# Patient Record
Sex: Female | Born: 1980 | Race: Black or African American | Hispanic: No | Marital: Single | Smoking: Former smoker
Health system: Southern US, Community
[De-identification: ages and names within clinical notes are randomized; demographics above are authoritative.]

## PROBLEM LIST (undated history)

## (undated) ENCOUNTER — Inpatient Hospital Stay (HOSPITAL_COMMUNITY): Payer: Self-pay

## (undated) DIAGNOSIS — F32A Depression, unspecified: Secondary | ICD-10-CM

## (undated) DIAGNOSIS — R002 Palpitations: Secondary | ICD-10-CM

## (undated) DIAGNOSIS — N390 Urinary tract infection, site not specified: Secondary | ICD-10-CM

## (undated) DIAGNOSIS — R252 Cramp and spasm: Secondary | ICD-10-CM

## (undated) DIAGNOSIS — N159 Renal tubulo-interstitial disease, unspecified: Secondary | ICD-10-CM

## (undated) DIAGNOSIS — F329 Major depressive disorder, single episode, unspecified: Secondary | ICD-10-CM

## (undated) DIAGNOSIS — R748 Abnormal levels of other serum enzymes: Secondary | ICD-10-CM

## (undated) DIAGNOSIS — F419 Anxiety disorder, unspecified: Secondary | ICD-10-CM

---

## 2008-07-16 ENCOUNTER — Ambulatory Visit: Payer: Self-pay | Admitting: Family Medicine

## 2008-07-16 ENCOUNTER — Inpatient Hospital Stay (HOSPITAL_COMMUNITY): Admission: AD | Admit: 2008-07-16 | Discharge: 2008-07-31 | Payer: Self-pay | Admitting: Obstetrics & Gynecology

## 2008-07-16 ENCOUNTER — Encounter (INDEPENDENT_AMBULATORY_CARE_PROVIDER_SITE_OTHER): Payer: Self-pay | Admitting: Family Medicine

## 2008-07-17 ENCOUNTER — Encounter: Payer: Self-pay | Admitting: Family Medicine

## 2008-07-17 LAB — CONVERTED CEMR LAB: Trich, Wet Prep: NONE SEEN

## 2008-07-28 ENCOUNTER — Encounter: Payer: Self-pay | Admitting: Obstetrics & Gynecology

## 2010-08-09 ENCOUNTER — Emergency Department (HOSPITAL_COMMUNITY): Payer: Medicaid Other

## 2010-08-09 ENCOUNTER — Emergency Department (HOSPITAL_COMMUNITY)
Admission: EM | Admit: 2010-08-09 | Discharge: 2010-08-10 | Disposition: A | Discharge status: Home / Self Care | Payer: Medicaid Other | Attending: Emergency Medicine | Admitting: Emergency Medicine

## 2010-08-09 ENCOUNTER — Ambulatory Visit (INDEPENDENT_AMBULATORY_CARE_PROVIDER_SITE_OTHER): Payer: Medicaid Other

## 2010-08-09 ENCOUNTER — Inpatient Hospital Stay (INDEPENDENT_AMBULATORY_CARE_PROVIDER_SITE_OTHER)
Admission: RE | Admit: 2010-08-09 | Discharge: 2010-08-09 | Disposition: A | Discharge status: Home / Self Care | Payer: Medicaid Other | Source: Ambulatory Visit | Attending: Family Medicine | Admitting: Family Medicine

## 2010-08-09 DIAGNOSIS — R079 Chest pain, unspecified: Secondary | ICD-10-CM

## 2010-08-09 DIAGNOSIS — R110 Nausea: Secondary | ICD-10-CM | POA: Insufficient documentation

## 2010-08-09 DIAGNOSIS — R1011 Right upper quadrant pain: Secondary | ICD-10-CM | POA: Insufficient documentation

## 2010-08-09 DIAGNOSIS — R630 Anorexia: Secondary | ICD-10-CM | POA: Insufficient documentation

## 2010-08-09 DIAGNOSIS — R0609 Other forms of dyspnea: Secondary | ICD-10-CM | POA: Insufficient documentation

## 2010-08-09 DIAGNOSIS — R0989 Other specified symptoms and signs involving the circulatory and respiratory systems: Secondary | ICD-10-CM | POA: Insufficient documentation

## 2010-08-09 DIAGNOSIS — N12 Tubulo-interstitial nephritis, not specified as acute or chronic: Secondary | ICD-10-CM | POA: Insufficient documentation

## 2010-08-09 LAB — URINALYSIS, ROUTINE W REFLEX MICROSCOPIC
Glucose, UA: NEGATIVE mg/dL
Ketones, ur: >80 mg/dL — AB
Protein, ur: 30 mg/dL — AB

## 2010-08-09 LAB — COMPREHENSIVE METABOLIC PANEL
ALT: 9 U/L (ref 0–35)
AST: 12 U/L (ref 0–37)
CO2: 24 mEq/L (ref 19–32)
Chloride: 101 mEq/L (ref 96–112)
Creatinine, Ser: 0.79 mg/dL (ref 0.4–1.2)
GFR calc Af Amer: >60 mL/min (ref >60)
GFR calc non Af Amer: >60 mL/min (ref >60)
Sodium: 135 mEq/L (ref 135–145)
Total Bilirubin: 0.4 mg/dL (ref 0.3–1.2)

## 2010-08-09 LAB — CBC
HCT: 36.9 % (ref 36.0–46.0)
Hemoglobin: 12.6 g/dL (ref 12.0–15.0)
MCH: 29.9 pg (ref 26.0–34.0)
RBC: 4.22 MIL/uL (ref 3.87–5.11)

## 2010-08-09 LAB — DIFFERENTIAL
Basophils Relative: 0 % (ref 0–1)
Lymphocytes Relative: 28 % (ref 12–46)
Monocytes Relative: 8 % (ref 3–12)
Neutro Abs: 4.9 10*3/uL (ref 1.7–7.7)
Neutrophils Relative %: 64 % (ref 43–77)

## 2010-08-09 LAB — URINE MICROSCOPIC-ADD ON

## 2010-08-10 ENCOUNTER — Emergency Department (HOSPITAL_COMMUNITY): Payer: Medicaid Other

## 2010-08-10 ENCOUNTER — Encounter (HOSPITAL_COMMUNITY): Payer: Self-pay | Admitting: Radiology

## 2010-08-10 LAB — PREGNANCY, URINE: Preg Test, Ur: NEGATIVE

## 2010-08-10 MED ORDER — IOHEXOL 300 MG/ML  SOLN
100.0000 mL | Freq: Once | INTRAMUSCULAR | Status: AC | PRN
  Administered 2010-08-10: 100 mL via INTRAVENOUS

## 2010-09-02 LAB — CBC
MCHC: 33.8 g/dL (ref 30.0–36.0)
Platelets: 221 10*3/uL (ref 150–400)
RBC: 3.05 MIL/uL — ABNORMAL LOW (ref 3.87–5.11)
RBC: 3.43 MIL/uL — ABNORMAL LOW (ref 3.87–5.11)
RDW: 12.7 % (ref 11.5–15.5)
WBC: 14.7 10*3/uL — ABNORMAL HIGH (ref 4.0–10.5)

## 2010-09-02 LAB — DIFFERENTIAL
Basophils Relative: 0 % (ref 0–1)
Lymphs Abs: 2.3 10*3/uL (ref 0.7–4.0)
Monocytes Relative: 7 % (ref 3–12)
Neutro Abs: 11.3 10*3/uL — ABNORMAL HIGH (ref 1.7–7.7)
Neutrophils Relative %: 77 % (ref 43–77)

## 2010-09-02 LAB — TYPE AND SCREEN: Antibody Screen: POSITIVE

## 2010-09-07 LAB — POCT URINALYSIS DIP (DEVICE)
Bilirubin Urine: NEGATIVE
Glucose, UA: NEGATIVE mg/dL
Nitrite: NEGATIVE

## 2010-09-07 LAB — DIFFERENTIAL
Lymphocytes Relative: 14 % (ref 12–46)
Lymphs Abs: 1.3 10*3/uL (ref 0.7–4.0)
Monocytes Absolute: 0.4 10*3/uL (ref 0.1–1.0)
Monocytes Relative: 4 % (ref 3–12)
Neutro Abs: 7.9 10*3/uL — ABNORMAL HIGH (ref 1.7–7.7)

## 2010-09-07 LAB — CBC
Hemoglobin: 10.6 g/dL — ABNORMAL LOW (ref 12.0–15.0)
RBC: 3.33 MIL/uL — ABNORMAL LOW (ref 3.87–5.11)
WBC: 9.7 10*3/uL (ref 4.0–10.5)

## 2010-09-07 LAB — HEMOGLOBINOPATHY EVALUATION
Hemoglobin Other: 0 % (ref 0.0–0.0)
Hgb A2 Quant: 3 % (ref 2.2–3.2)
Hgb A2 Quant: 3 % (ref 2.2–3.2)
Hgb A: 97 % (ref 96.8–97.8)
Hgb F Quant: 0 % (ref 0.0–2.0)
Hgb F Quant: 0 % (ref 0.0–2.0)

## 2010-09-07 LAB — RUBELLA SCREEN: Rubella: 233.3 IU/mL — ABNORMAL HIGH

## 2010-09-07 LAB — HIV ANTIBODY (ROUTINE TESTING W REFLEX): HIV: NONREACTIVE

## 2010-10-05 NOTE — Op Note (Signed)
Tammy Ray, Tammy Ray             ACCOUNT NO.:  [id number]   MEDICAL RECORD NO.:  [id number]           PATIENT TYPE:   LOCATION:                                 FACILITY:   PHYSICIAN:  Lesly Dukes, M.D. DATE OF BIRTH:  August 03, 1980   DATE OF PROCEDURE:  07/28/2008  DATE OF DISCHARGE:                               OPERATIVE REPORT   INDICATIONS FOR SURGERY:  The patient is a 30 year old G5, para 2-0-2-2  with PPROM for weeks, currently at 32 weeks and 2 estimated gestational  age.  I was called to the room for a cord prolapse and the cord was  extruding from the vagina.  The patient was quickly consented for C-  section and rushed back for emergency C-section.  IV access was  obtained.  Midwife, Dr. Zerita Boers held the presenting part off the  cord and there was a pulse of approximately 100 until uterine incision.   PREOPERATIVE DIAGNOSIS:  A 30 year old G5 para 2-0-2-2 at 32 weeks and 2  days estimated gestational age with PPROM and cord prolapse.   POSTOPERATIVE DIAGNOSIS:  A 30 year old G5 para 2-0-2-2 at 32 weeks and  2 days estimated gestational age with PPROM and cord prolapse.   PROCEDURE:  Primary classical C-section.   SURGEON:  Lesly Dukes, MD   ASSISTANT:  Zerita Boers, certified nurse midwife.   ANESTHESIA:  General.   FINDINGS:  Viable infant, compound presentation with foot and hand out  of the incision, eventually rotated and delivered vertex.  Arterial cord  blood gas 7.25.  Gross normal uterus, ovaries, and fallopian tube.   ESTIMATED BLOOD LOSS:  800.   COMPLICATIONS:  None.  Placenta to Pathology.   PROCEDURE:  After informed consent was obtained, the patient was taken  to the operating room where general anesthesia was induced.  The patient  was already in dorsal supine position with a leftward tilt, prepared and  draped in normal sterile fashion with a Foley in the bladder.  Once  general intubation was initiated, the Pfannenstiel skin  incision was  made with scalpel and carried down through fascia.  The fascia was  incised in the midline and this was extended bilaterally bluntly.  The  superior and inferior aspects of the fascial incision were grasped with  Kocher clamps, tented up, and dissected off sharply from the underlying  rectus muscles.  The rectus muscles were separated in midline.  The  peritoneum was entered bluntly.  There was no developed lower uterine  segment nor evidence of a good presenting part.  The patient felt breech  on vaginal exam as well.  The classical incision was made in a vertical  fashion and extended with the bandage scissors.  The incision went all  the way to the fundus in order to get the baby out.  The cord was  clamped and cut and the baby was handed to the awaiting pediatrician.  Cord blood was sent for type and screen.  Arterial cord blood gas was  sent.  Placenta delivered manually and was sent to Pathology.  The  uterus was  cleared of all clots and debris and Pitocin was given to aid  in hemostasis.  The uterine incision was closed in two layers with 0  Monocryl in a running fashion.  The uterus was very friable, probably  evidence of infection, and the myometrium was ripping with the stitches,  so they were not locked in order to help keep the stitches from tearing  through.  The uterus was closed in two layers.  There was good  hemostasis at the end of the procedure.  The uterus was returned back to  the abdomen and noted again to be hemostatic off tension and inside the  abdomen.  The peritoneum was closed with 0 Vicryl.  The rectus muscle  was noted to be hemostatic.  This fascia was closed with 0 Vicryl in a  running fashion.  Subcutaneous tissue was irrigated and found to be  hemostatic and the skin was closed with staples.  Pressure dressing was  applied to the abdomen.  The patient tolerated the procedure well.  Sponge, lap, instrument, and needle count correct x2 and the  patient to  recovery room in stable condition.      Lesly Dukes, M.D.  Electronically Signed     KHL/MEDQ  D:  07/28/2008  T:  07/29/2008  Job:  [id number]

## 2010-10-05 NOTE — Discharge Summary (Signed)
Tammy Ray, Tammy Ray             ACCOUNT NO.:  [id number]   MEDICAL RECORD NO.:  [id number]          PATIENT TYPE:  INP   LOCATION:  9319                          FACILITY:  [facility]   PHYSICIAN:  Norton Blizzard, MD    DATE OF BIRTH:  1980-08-11   DATE OF ADMISSION:  07/16/2008  DATE OF DISCHARGE:  07/31/2008                               DISCHARGE SUMMARY   ADMISSION DIAGNOSES:  1. Intrauterine pregnancy at 30 and 3/7th weeks.  2. Preterm premature rupture of membranes.   DISCHARGE DIAGNOSIS:  Status post primary low-transverse cesarean  section for cord prolapse at 32 and 2/7th weeks' gestation.   PERTINENT STUDIES:  Preoperative hemoglobin of 11.1, postoperative  hemoglobin of 9.9.  Rubella immune.  B positive.   BRIEF HOSPITAL COURSE:  The patient is a 30 year old gravida 5, para 2-0-  2-2 who was initially admitted at 22 and 3/7th weeks gestation on  July 16, 2008, in the setting of preterm premature rupture of  membranes.  The patient was started on latency antibiotics, given  betamethasone and was maintained on bedrest and IV fluids.  The patient  was stable and did not show any signs or symptoms of chorioamnionitis;  however, on the night of July 28, 2008, the patient was noted to have a  prolapsed cord on examination, which prompted urgent primary cesarean  section.  She underwent a classical C-section because the patient did  not have a developed lower uterine segment, and the infant was noted to  be in breech presentation.  For further details of this operation,  please refer to separate dictated operative report.  The patient had no  complications during the procedure and had an uncomplicated postpartum  and postoperative course.  She was breast and bottle feeding.  She did  want a tubal ligation for contraception, but is currently considering  other methods.  By postoperative day #3, her pain was controlled on oral  pain medications.  She was ambulating and voiding  without difficulty and  passing flatus.  The patient was deemed stable for discharge to home.   DISCHARGE MEDICATIONS:  1. Percocet 5/325, 1-2 tablets p.o. q.6 h. p.r.n. pain.  2. Ibuprofen 600 mg p.o. q.6 h. p.r.n. pain.  3. Colace 100 mg p.o. b.i.d. p.r.n. constipation.  4. Prenatal vitamins 1 tablet p.o. daily.  5. Ferrous sulfate 325 mg p.o. b.i.d.   DISCHARGE INSTRUCTIONS:  The patient was given routine postpartum  instructions and told to call for any concerns or report to the MAU.  She will follow up in the health department in 6 weeks.  At the time of  discharge, the patient's infant is still in the newborn intensive care  unit.  If the patient still wants to proceed with the interval bilateral  tubal ligation, she will need to follow up in the GYN clinic in 2 weeks  to be scheduled for this procedure.      Norton Blizzard, MD  Electronically Signed     UAD/MEDQ  D:  07/31/2008  T:  07/31/2008  Job:  [id number] [id number]

## 2010-10-05 NOTE — Consult Note (Signed)
NAMEVERCIE, POKORNY             ACCOUNT NO.:  [id number]   MEDICAL RECORD NO.:  [id number]          PATIENT TYPE:  INP   LOCATION:  9319                          FACILITY:  [facility]   PHYSICIAN:  Antonietta Breach, M.D.  DATE OF BIRTH:  Dec 11, 1980   DATE OF CONSULTATION:  07/28/2008  DATE OF DISCHARGE:  07/31/2008                                 CONSULTATION   REASON FOR CONSULTATION:  Depression.   REQUESTING PHYSICIAN:  Scheryl Darter, MD   HISTORY OF PRESENT ILLNESS:  Ms. Filice is a 30 year old female admitted  to the [facility] with ruptured membranes at 32  weeks' gestation.   She has constructive future goals and interests; however, her mood has  been mildly depressed.  She has been displaying a mildly flat affect.  She denies any thoughts of harming herself or others.  She has no  hallucinations or delusions.  Her energy is mildly decreased.  She is  oriented to all spheres.  Her memory function is intact.  She is not  agitated or combative.   PAST PSYCHIATRIC HISTORY:  She does describe some multiple-day episodes  where she has experienced increased energy and decreased need for sleep.  She also has made a suicide attempt in the past with an overdose.   She has been treated with lithium as well as Seroquel in the past and  Paxil.  She stopped those medicines when she moved to [place] a  number of months ago and she did not have Medicaid.   In review of the past medical record, no additional history is gained on  her mental background.   FAMILY PSYCHIATRIC HISTORY:  None known.   SOCIAL HISTORY:  Ms. Augusta' boyfriend does want to have the baby.  This  was an unplanned pregnancy.  She denies any alcohol or illegal drugs.   PAST MEDICAL HISTORY:  Ruptured membranes at 32 weeks' gestation.  She  has not had any prenatal care.   She did have some difficulty with Ambien in the last evening where she  had 2 hallucination episodes.  She is currently on  Ambien 10 mg at  bedtime p.r.n.   She has no known drug allergies.   LABORATORY DATA:  WBC 14.7, hemoglobin 11.1, platelet count 249.  HIV  July 16, 2008, was negative.   REVIEW OF SYSTEMS:  Constitutional, head, eyes, ears, nose and throat,  mouth, neurologic, psychiatric, cardiovascular, respiratory,  gastrointestinal, genitourinary, skin, musculoskeletal, hematologic,  lymphatic, endocrine, metabolic all are unremarkable.   PHYSICAL EXAMINATION:  VITAL SIGNS:  Temperature 98.1, pulse 95,  respiratory rate 18, blood pressure 110/81.   GENERAL APPEARANCE:  Ms. Risinger is a young female appearing her  chronologic age, sitting up in her hospital chair with no abnormal  involuntary movements.   MENTAL STATUS:  Ms. Landrigan is alert.  Her eye contact is good.  Her  attention span is normal.  Her concentration is normal.  Her affect is  mildly flat at baseline, but as the conversation proceeds, she does have  a broad and appropriate smile regarding the content of conversation.  Her  mood is mildly depressed.  She is oriented to all spheres.  Her  memory is intact immediate, recent, and remote.   Her fund of knowledge and intelligence are within normal limits.  Her  speech involves normal rate and prosody without dysarthria.   Thought process is logical, coherent, goal-directed.  No looseness of  associations of thought content.  No thoughts of harming herself or  others.  No delusions or hallucinations.  Insight is intact.  Judgment  is intact.   ASSESSMENT:  AXIS I:  293.83 mood disorder, not otherwise specified  (history of idiopathic illness as well as current general medical  factor), depressed.  296.80 bipolar disorder, not otherwise specified.  AXIS II:  Deferred.  AXIS III:  See past medical history.  AXIS IV:  Primary support group, general medical.  AXIS V:  55.   Ms. Nutter is not at risk to harm herself or others.  She agrees to call  the Emergency Services  immediately if she develops thoughts of harming  herself or others, distress, racing thoughts, or elevated energy.   She has had some negative experiences with medications in the past, and  at this time, she declined psychotropic medications other than wanting  Benadryl for insomnia.   The indications, alternatives, and adverse effects of Benadryl were  explained to the patient.  She understands that might proceed with 45-50  mg p.o. at bedtime p.r.n. insomnia.   The undersigned discussed the indications, alternatives, and adverse  effects of Lamictal for bipolar disorder in preventing cyclic mood.  The  discussion included the risk of lethal rash.  Ms. Salas understands;  however, she declines any psychotropic medication at this time.  She  agrees to think about it.   She does not have any symptoms that would mandate treatment, in other  words she is not committable.   Also, she does agree to outpatient counseling to start immediately after  her discharge.   We would ask the social worker to arrange outpatient psychiatric  followup along with counseling during the first week of discharge.  In  addition to counseling for coping skills and stress management,  following up with a psychiatrist will allow her to maintain education  and the opportunity to restart preventive psychotropic medication.   We would continue to remind Ms. Saxe of the standard recommendation of  continuing at least a mood stabilizer such as Lamictal when having a  diagnosis of bipolar disorder.   The undersigned provided ego supportive psychotherapy in addition to  education.   When utilizing the Benadryl, would be cautious about oversedation or dry  mouth.      Antonietta Breach, M.D.  Electronically Signed     JW/MEDQ  D:  08/03/2008  T:  08/04/2008  Job:  [id number]

## 2011-07-24 ENCOUNTER — Encounter (HOSPITAL_COMMUNITY): Payer: Self-pay

## 2011-07-24 ENCOUNTER — Emergency Department (HOSPITAL_COMMUNITY): Payer: Medicaid Other

## 2011-07-24 ENCOUNTER — Emergency Department (HOSPITAL_COMMUNITY)
Admission: EM | Admit: 2011-07-24 | Discharge: 2011-07-25 | Disposition: A | Discharge status: Home Health | Payer: Medicaid Other | Attending: Emergency Medicine | Admitting: Emergency Medicine

## 2011-07-24 DIAGNOSIS — R4589 Other symptoms and signs involving emotional state: Secondary | ICD-10-CM | POA: Insufficient documentation

## 2011-07-24 DIAGNOSIS — Z331 Pregnant state, incidental: Secondary | ICD-10-CM | POA: Insufficient documentation

## 2011-07-24 DIAGNOSIS — O2390 Unspecified genitourinary tract infection in pregnancy, unspecified trimester: Secondary | ICD-10-CM | POA: Insufficient documentation

## 2011-07-24 DIAGNOSIS — N760 Acute vaginitis: Secondary | ICD-10-CM | POA: Insufficient documentation

## 2011-07-24 DIAGNOSIS — A499 Bacterial infection, unspecified: Secondary | ICD-10-CM | POA: Insufficient documentation

## 2011-07-24 DIAGNOSIS — N390 Urinary tract infection, site not specified: Secondary | ICD-10-CM

## 2011-07-24 DIAGNOSIS — B9689 Other specified bacterial agents as the cause of diseases classified elsewhere: Secondary | ICD-10-CM | POA: Insufficient documentation

## 2011-07-24 DIAGNOSIS — N949 Unspecified condition associated with female genital organs and menstrual cycle: Secondary | ICD-10-CM | POA: Insufficient documentation

## 2011-07-24 HISTORY — DX: Renal tubulo-interstitial disease, unspecified: N15.9

## 2011-07-24 HISTORY — DX: Urinary tract infection, site not specified: N39.0

## 2011-07-24 LAB — CBC
HCT: 36.8 % (ref 36.0–46.0)
MCH: 30 pg (ref 26.0–34.0)
MCHC: 34.2 g/dL (ref 30.0–36.0)
RDW: 12.8 % (ref 11.5–15.5)

## 2011-07-24 LAB — HCG, QUANTITATIVE, PREGNANCY: hCG, Beta Chain, Quant, S: 42270 m[IU]/mL — ABNORMAL HIGH (ref <5)

## 2011-07-24 LAB — DIFFERENTIAL
Basophils Absolute: 0 10*3/uL (ref 0.0–0.1)
Basophils Relative: 0 % (ref 0–1)
Eosinophils Absolute: 0 10*3/uL (ref 0.0–0.7)
Monocytes Absolute: 0.6 10*3/uL (ref 0.1–1.0)
Neutro Abs: 4.9 10*3/uL (ref 1.7–7.7)

## 2011-07-24 LAB — URINALYSIS, ROUTINE W REFLEX MICROSCOPIC
Glucose, UA: NEGATIVE mg/dL
Hgb urine dipstick: NEGATIVE
Specific Gravity, Urine: 1.034 — ABNORMAL HIGH (ref 1.005–1.030)
pH: 6.5 (ref 5.0–8.0)

## 2011-07-24 LAB — ABO/RH: ABO/RH(D): B POS

## 2011-07-24 LAB — URINE MICROSCOPIC-ADD ON

## 2011-07-24 LAB — COMPREHENSIVE METABOLIC PANEL
AST: 11 U/L (ref 0–37)
Albumin: 4.3 g/dL (ref 3.5–5.2)
BUN: 12 mg/dL (ref 6–23)
Calcium: 10.1 mg/dL (ref 8.4–10.5)
Chloride: 96 mEq/L (ref 96–112)
Creatinine, Ser: 0.74 mg/dL (ref 0.50–1.10)
Total Bilirubin: 0.3 mg/dL (ref 0.3–1.2)
Total Protein: 8.3 g/dL (ref 6.0–8.3)

## 2011-07-24 LAB — LIPASE, BLOOD: Lipase: 20 U/L (ref 11–59)

## 2011-07-24 NOTE — ED Notes (Signed)
Pt was hysterical when she was told about her pregnancy.  She does not want to carry the pregnancy due to her last pregnancy's complications (preterm infant).  Chaplain was called to talk with pt.  Pt requested that her significant other be called to come and sit with her.  Pt has currently calmed down and is talking with family.

## 2011-07-24 NOTE — ED Notes (Signed)
Pt c/o abdominal pain & vomiting that started yesterday.  Denies diarrhea.  Took a midol for pain.  LMP sometime in January.

## 2011-07-24 NOTE — ED Notes (Signed)
Denies diarrhea and urinary problems

## 2011-07-25 ENCOUNTER — Encounter (HOSPITAL_COMMUNITY): Payer: Self-pay | Admitting: Emergency Medicine

## 2011-07-25 LAB — RAPID URINE DRUG SCREEN, HOSP PERFORMED
Barbiturates: NOT DETECTED
Benzodiazepines: NOT DETECTED
Cocaine: NOT DETECTED
Opiates: NOT DETECTED

## 2011-07-25 LAB — WET PREP, GENITAL

## 2011-07-25 LAB — GC/CHLAMYDIA PROBE AMP, GENITAL: Chlamydia, DNA Probe: NEGATIVE

## 2011-07-25 MED ORDER — CEPHALEXIN 500 MG PO CAPS: 500.0000 mg | ORAL_CAPSULE | Freq: Four times a day (QID) | ORAL | Status: AC

## 2011-07-25 MED ORDER — ACETAMINOPHEN 325 MG PO TABS: 650.0000 mg | ORAL_TABLET | ORAL | Status: DC | PRN

## 2011-07-25 MED ORDER — CEPHALEXIN 500 MG PO CAPS
500.0000 mg | ORAL_CAPSULE | Freq: Four times a day (QID) | ORAL | Status: DC
  Administered 2011-07-25: 500 mg via ORAL
  Filled 2011-07-25: qty 1

## 2011-07-25 MED ORDER — METRONIDAZOLE 500 MG PO TABS
500.0000 mg | ORAL_TABLET | Freq: Two times a day (BID) | ORAL | Status: DC
  Administered 2011-07-25: 500 mg via ORAL
  Filled 2011-07-25: qty 1

## 2011-07-25 MED ORDER — ALUM & MAG HYDROXIDE-SIMETH 200-200-20 MG/5ML PO SUSP: 30.0000 mL | ORAL | Status: DC | PRN

## 2011-07-25 MED ORDER — ONDANSETRON HCL 4 MG PO TABS: 4.0000 mg | ORAL_TABLET | Freq: Three times a day (TID) | ORAL | Status: DC | PRN

## 2011-07-25 NOTE — Progress Notes (Signed)
Additional Note: The information contained in my previous note is of a confidential nature and should NOT be revealed to or discussed with pt's boyfriend without pt's private consent as revelation of some facts may result in harm to the pt.

## 2011-07-25 NOTE — ED Provider Notes (Signed)
  Physical Exam  BP 100/54  Pulse 79  Temp(Src) 97.7 F (36.5 C) (Oral)  Resp 20  SpO2 100%  LMP 07/10/2011  Physical Exam  ED Course  Procedures  MDM The patient exhibited a change of shift, results show that patient has urinary tract infection as well as bacterial vaginosis. Antibiotics ordered including Flagyl and Keflex. Vital signs remained stable without fever or tachycardia or hypotension. Patient has been evaluated by behavioral health and found to be suicidal. Will require further involuntary commitment and evaluation by behavioral health professionals.      Vida Roller, MD 07/25/11 [phone]

## 2011-07-25 NOTE — Discharge Instructions (Signed)
ABCs of Pregnancy A Antepartum care is very important. Be sure you see your doctor and get prenatal care as soon as you think you are pregnant. At this time, you will be tested for infection, genetic abnormalities and potential problems with you and the pregnancy. This is the time to discuss diet, exercise, work, medications, labor, pain medication during labor and the possibility of a cesarean delivery. Ask any questions that may concern you. It is important to see your doctor regularly throughout your pregnancy. Avoid exposure to toxic substances and chemicals - such as cleaning solvents, lead and mercury, some insecticides, and paint. Pregnant women should avoid exposure to paint fumes, and fumes that cause you to feel ill, dizzy or faint. When possible, it is a good idea to have a pre-pregnancy consultation with your caregiver to begin some important recommendations your caregiver suggests such as, taking folic acid, exercising, quitting smoking, avoiding alcoholic beverages, etc. B Breastfeeding is the healthiest choice for both you and your baby. It has many nutritional benefits for the baby and health benefits for the mother. It also creates a very tight and loving bond between the baby and mother. Talk to your doctor, your family and friends, and your employer about how you choose to feed your baby and how they can support you in your decision. Not all birth defects can be prevented, but a woman can take actions that may increase her chance of having a healthy baby. Many birth defects happen very early in pregnancy, sometimes before a woman even knows she is pregnant. Birth defects or abnormalities of any child in your or the father's family should be discussed with your caregiver. Get a good support bra as your breast size changes. Wear it especially when you exercise and when nursing.  C Celebrate the news of your pregnancy with the your spouse/father and family. Childbirth classes are helpful to  take for you and the spouse/father because it helps to understand what happens during the pregnancy, labor and delivery. Cesarean delivery should be discussed with your doctor so you are prepared for that possibility. The pros and cons of circumcision if it is a boy, should be discussed with your pediatrician. Cigarette smoking during pregnancy can result in low birth weight babies. It has been associated with infertility, miscarriages, tubal pregnancies, infant death (mortality) and poor health (morbidity) in childhood. Additionally, cigarette smoking may cause long-term learning disabilities. If you smoke, you should try to quit before getting pregnant and not smoke during the pregnancy. Secondary smoke may also harm a mother and her developing baby. It is a good idea to ask people to stop smoking around you during your pregnancy and after the baby is born. Extra calcium is necessary when you are pregnant and is found in your prenatal vitamin, in dairy products, green leafy vegetables and in calcium supplements. D A healthy diet according to your current weight and height, along with vitamins and mineral supplements should be discussed with your caregiver. Domestic abuse or violence should be made known to your doctor right away to get the situation corrected. Drink more water when you exercise to keep hydrated. Discomfort of your back and legs usually develops and progresses from the middle of the second trimester through to delivery of the baby. This is because of the enlarging baby and uterus, which may also affect your balance. Do not take illegal drugs. Illegal drugs can seriously harm the baby and you. Drink extra fluids (water is best) throughout pregnancy to help  your body keep up with the increases in your blood volume. Drink at least 6 to 8 glasses of water, fruit juice, or milk each day. A good way to know you are drinking enough fluid is when your urine looks almost like clear water or is very light  yellow.  E Eat healthy to get the nutrients you and your unborn baby need. Your meals should include the five basic food groups. Exercise (30 minutes of light to moderate exercise a day) is important and encouraged during pregnancy, if there are no medical problems or problems with the pregnancy. Exercise that causes discomfort or dizziness should be stopped and reported to your caregiver. Emotions during pregnancy can change from being ecstatic to depression and should be understood by you, your partner and your family. F Fetal screening with ultrasound, amniocentesis and monitoring during pregnancy and labor is common and sometimes necessary. Take 400 micrograms of folic acid daily both before, when possible, and during the first few months of pregnancy to reduce the risk of birth defects of the brain and spine. All women who could possibly become pregnant should take a vitamin with folic acid, every day. It is also important to eat a healthy diet with fortified foods (enriched grain products, including cereals, rice, breads, and pastas) and foods with natural sources of folate (orange juice, green leafy vegetables, beans, peanuts, broccoli, asparagus, peas, and lentils). The father should be involved with all aspects of the pregnancy including, the prenatal care, childbirth classes, labor, delivery, and postpartum time. Fathers may also have emotional concerns about being a father, financial needs, and raising a family. G Genetic testing should be done appropriately. It is important to know your family and the father's history. If there have been problems with pregnancies or birth defects in your family, report these to your doctor. Also, genetic counselors can talk with you about the information you might need in making decisions about having a family. You can call a major medical center in your area for help in finding a board-certified genetic counselor. Genetic testing and counseling should be done  before pregnancy when possible, especially if there is a history of problems in the mother's or father's family. Certain ethnic backgrounds are more at risk for genetic defects. H Get familiar with the hospital where you will be having your baby. Get to know how long it takes to get there, the labor and delivery area, and the hospital procedures. Be sure your medical insurance is accepted there. Get your home ready for the baby including, clothes, the baby's room (when possible), furniture and car seat. Hand washing is important throughout the day, especially after handling raw meat and poultry, changing the baby's diaper or using the bathroom. This can help prevent the spread of many bacteria and viruses that cause infection. Your hair may become dry and thinner, but will return to normal a few weeks after the baby is born. Heartburn is a common problem that can be treated by taking antacids recommended by your caregiver, eating smaller meals 5 or 6 times a day, not drinking liquids when eating, drinking between meals and raising the head of your bed 2 to 3 inches. I Insurance to cover you, the baby, doctor and hospital should be reviewed so that you will be prepared to pay any costs not covered by your insurance plan. If you do not have medical insurance, there are usually clinics and services available for you in your community. Take 30 milligrams of iron during  your pregnancy as prescribed by your doctor to reduce the risk of low red blood cells (anemia) later in pregnancy. All women of childbearing age should eat a diet rich in iron. J There should be a joint effort for the mother, father and any other children to adapt to the pregnancy financially, emotionally, and psychologically during the pregnancy. Join a support group for moms-to-be. Or, join a class on parenting or childbirth. Have the family participate when possible. K Know your limits. Let your caregiver know if you experience any of the  following:   Pain of any kind.   Strong cramps.   You develop a lot of weight in a short period of time (5 pounds in 3 to 5 days).   Vaginal bleeding, leaking of amniotic fluid.   Headache, vision problems.   Dizziness, fainting, shortness of breath.   Chest pain.   Fever of 102 F (38.9 C) or higher.   Gush of clear fluid from your vagina.   Painful urination.   Domestic violence.   Irregular heartbeat (palpitations).   Rapid beating of the heart (tachycardia).   Constant feeling sick to your stomach (nauseous) and vomiting.   Trouble walking, fluid retention (edema).   Muscle weakness.   If your baby has decreased activity.   Persistent diarrhea.   Abnormal vaginal discharge.   Uterine contractions at 20-minute intervals.   Back pain that travels down your leg.  L Learn and practice that what you eat and drink should be in moderation and healthy for you and your baby. Legal drugs such as alcohol and caffeine are important issues for pregnant women. There is no safe amount of alcohol a woman can drink while pregnant. Fetal alcohol syndrome, a disorder characterized by growth retardation, facial abnormalities, and central nervous system dysfunction, is caused by a woman's use of alcohol during pregnancy. Caffeine, found in tea, coffee, soft drinks and chocolate, should also be limited. Be sure to read labels when trying to cut down on caffeine during pregnancy. More than 200 foods, beverages, and over-the-counter medications contain caffeine and have a high salt content! There are coffees and teas that do not contain caffeine. M Medical conditions such as diabetes, epilepsy, and high blood pressure should be treated and kept under control before pregnancy when possible, but especially during pregnancy. Ask your caregiver about any medications that may need to be changed or adjusted during pregnancy. If you are currently taking any medications, ask your caregiver if it  is safe to take them while you are pregnant or before getting pregnant when possible. Also, be sure to discuss any herbs or vitamins you are taking. They are medicines, too! Discuss with your doctor all medications, prescribed and over-the-counter, that you are taking. During your prenatal visit, discuss the medications your doctor may give you during labor and delivery. N Never be afraid to ask your doctor or caregiver questions about your health, the progress of the pregnancy, family problems, stressful situations, and recommendation for a pediatrician, if you do not have one. It is better to take all precautions and discuss any questions or concerns you may have during your office visits. It is a good idea to write down your questions before you visit the doctor. O Over-the-counter cough and cold remedies may contain alcohol or other ingredients that should be avoided during pregnancy. Ask your caregiver about prescription, herbs or over-the-counter medications that you are taking or may consider taking while pregnant.  P Physical activity during pregnancy can  benefit both you and your baby by lessening discomfort and fatigue, providing a sense of well-being, and increasing the likelihood of early recovery after delivery. Light to moderate exercise during pregnancy strengthens the belly (abdominal) and back muscles. This helps improve posture. Practicing yoga, walking, swimming, and cycling on a stationary bicycle are usually safe exercises for pregnant women. Avoid scuba diving, exercise at high altitudes (over 3000 feet), skiing, horseback riding, contact sports, etc. Always check with your doctor before beginning any kind of exercise, especially during pregnancy and especially if you did not exercise before getting pregnant. Q Queasiness, stomach upset and morning sickness are common during pregnancy. Eating a couple of crackers or dry toast before getting out of bed. Foods that you normally love may  make you feel sick to your stomach. You may need to substitute other nutritious foods. Eating 5 or 6 small meals a day instead of 3 large ones may make you feel better. Do not drink with your meals, drink between meals. Questions that you have should be written down and asked during your prenatal visits. R Read about and make plans to baby-proof your home. There are important tips for making your home a safer environment for your baby. Review the tips and make your home safer for you and your baby. Read food labels regarding calories, salt and fat content in the food. S Saunas, hot tubs, and steam rooms should be avoided while you are pregnant. Excessive high heat may be harmful during your pregnancy. Your caregiver will screen and examine you for sexually transmitted diseases and genetic disorders during your prenatal visits. Learn the signs of labor. Sexual relations while pregnant is safe unless there is a medical or pregnancy problem and your caregiver advises against it. T Traveling long distances should be avoided especially in the third trimester of your pregnancy. If you do have to travel out of state, be sure to take a copy of your medical records and medical insurance plan with you. You should not travel long distances without seeing your doctor first. Most airlines will not allow you to travel after 36 weeks of pregnancy. Toxoplasmosis is an infection caused by a parasite that can seriously harm an unborn baby. Avoid eating undercooked meat and handling cat litter. Be sure to wear gloves when gardening. Tingling of the hands and fingers is not unusual and is due to fluid retention. This will go away after the baby is born. U Womb (uterus) size increases during the first trimester. Your kidneys will begin to function more efficiently. This may cause you to feel the need to urinate more often. You may also leak urine when sneezing, coughing or laughing. This is due to the growing uterus pressing  against your bladder, which lies directly in front of and slightly under the uterus during the first few months of pregnancy. If you experience burning along with frequency of urination or bloody urine, be sure to tell your doctor. The size of your uterus in the third trimester may cause a problem with your balance. It is advisable to maintain good posture and avoid wearing high heels during this time. An ultrasound of your baby may be necessary during your pregnancy and is safe for you and your baby. V Vaccinations are an important concern for pregnant women. Get needed vaccines before pregnancy. Center for Disease Control (http://www.wolf.info/) has clear guidelines for the use of vaccines during pregnancy. Review the list, be sure to discuss it with your doctor. Prenatal vitamins are helpful  and healthy for you and the baby. Do not take extra vitamins except what is recommended. Taking too much of certain vitamins can cause overdose problems. Continuous vomiting should be reported to your caregiver. Varicose veins may appear especially if there is a family history of varicose veins. They should subside after the delivery of the baby. Support hose helps if there is leg discomfort. W Being overweight or underweight during pregnancy may cause problems. Try to get within 15 pounds of your ideal weight before pregnancy. Remember, pregnancy is not a time to be dieting! Do not stop eating or start skipping meals as your weight increases. Both you and your baby need the calories and nutrition you receive from a healthy diet. Be sure to consult with your doctor about your diet. There is a formula and diet plan available depending on whether you are overweight or underweight. Your caregiver or nutritionist can help and advise you if necessary. X Avoid X-rays. If you must have dental work or diagnostic tests, tell your dentist or physician that you are pregnant so that extra care can be taken. X-rays should only be taken when  the risks of not taking them outweigh the risk of taking them. If needed, only the minimum amount of radiation should be used. When X-rays are necessary, protective lead shields should be used to cover areas of the body that are not being X-rayed. Y Your baby loves you. Breastfeeding your baby creates a loving and very close bond between the two of you. Give your baby a healthy environment to live in while you are pregnant. Infants and children require constant care and guidance. Their health and safety should be carefully watched at all times. After the baby is born, rest or take a nap when the baby is sleeping. Z Get your ZZZs. Be sure to get plenty of rest. Resting on your side as often as possible, especially on your left side is advised. It provides the best circulation to your baby and helps reduce swelling. Try taking a nap for 30 to 45 minutes in the afternoon when possible. After the baby is born rest or take a nap when the baby is sleeping. Try elevating your feet for that amount of time when possible. It helps the circulation in your legs and helps reduce swelling.  Most information courtesy of the CDC. Document Released: 05/09/2005 Document Revised: 04/28/2011 Document Reviewed: 01/21/2009 [facility] Patient Information 2012 [place], [place].Bacterial Vaginosis Bacterial vaginosis is an infection of the vagina. A healthy vagina has many kinds of good germs (bacteria). Sometimes the number of good germs can change. This allows bad germs to move in and cause an infection. You may be given medicine (antibiotics) to treat the infection. Or, you may not need treatment at all. HOME CARE  Take your medicine as told. Finish them even if you start to feel better.   Do not have sex until you finish your medicine.   Do not douche.   Practice safe sex.   Tell your sex partner that you have an infection. They should see their doctor for treatment if they have problems.  GET HELP RIGHT AWAY IF:  You  do not get better after 3 days of treatment.   You have grey fluid (discharge) coming from your vagina.   You have pain.   You have a temperature of 102 F (38.9 C) or higher.  MAKE SURE YOU:   Understand these instructions.   Will watch your condition.   Will get  help right away if you are not doing well or get worse.  Document Released: 02/16/2008 Document Revised: 04/28/2011 Document Reviewed: 02/16/2008 [facility] Patient Information 2012 [place], [place].   Asymptomatic Bacteriuria, Female Your urine study shows bacteria in your urine. You do not have the usual symptoms of burning or frequent urination. This is why it is called asymptomatic. You may need treatment with antibiotics. Treatment is especially important if you are pregnant. Sometimes this condition can progress to a more severe bladder or kidney infection. Symptoms include burning when urinating, back pain, fever, nausea, or vomiting. Take your antibiotics as directed. Finish them even if you start to feel better. Drink enough water and fluids to keep your urine clear or pale yellow. Go to the bathroom more frequently to keep your bladder empty. Keep the area around the vagina and rectum clean. Wipe yourself from front to back after urinating. Call your caregiver to arrange for follow-up care.  SEEK IMMEDIATE MEDICAL CARE IF:  You develop repeated vomiting.   You develop severe back or abdominal pain.   You have abnormal vaginal discharge or bleeding.   You have blood in the urine.   You develop cramping or abdominal pain.   You have a fever.  If you are pregnant and develop any of the above problems see your caregiver or seek care immediately. Document Released: 05/09/2005 Document Revised: 04/28/2011 Document Reviewed: 03/25/2009 [facility] Patient Information 2012 [place], [place].

## 2011-07-25 NOTE — Progress Notes (Signed)
Check to see if pt had a pcp She confirmed she did not by [facility]

## 2011-07-25 NOTE — ED Provider Notes (Signed)
History     CSN: [id number]  Arrival date & time 07/24/11  4098   First MD Initiated Contact with Patient 07/24/11 2044      Chief Complaint  Patient presents with  . Abdominal Pain  . Nausea  . Emesis    HPI Pt was seen at 2100.   Per pt, c/o gradual onset and persistence of multiple intermittent episodes of N/V and vague generalized abd "pain" since yesterday.  Pt endorses her LMP was approx 2 months ago, unsure if she is pregnant.  Pt has Hx G5P3.  Endorses today's symptoms are similar to when she was last pregnant.  Denies vaginal bleeding/discharge, no back/flank pain, no diarrhea, no fevers, no CP/SOB, no rash.     Past Medical History  Diagnosis Date  . UTI (lower urinary tract infection)   . Kidney infection     History reviewed. No pertinent past surgical history.   History  Substance Use Topics  . Smoking status: Current Everyday Smoker  . Smokeless tobacco: Not on file  . Alcohol Use: Yes    OB History    Grav Para Term Preterm Abortions TAB SAB Ect Mult Living   5         3      Review of Systems ROS: Statement: All systems negative except as marked or noted in the HPI; Constitutional: Negative for fever and chills. ; ; Eyes: Negative for eye pain, redness and discharge. ; ; ENMT: Negative for ear pain, hoarseness, nasal congestion, sinus pressure and sore throat. ; ; Cardiovascular: Negative for chest pain, palpitations, diaphoresis, dyspnea and peripheral edema. ; ; Respiratory: Negative for cough, wheezing and stridor. ; ; Gastrointestinal: +N/V, abd pain. Negative for diarrhea, blood in stool, hematemesis, jaundice and rectal bleeding. . ; ; Genitourinary: Negative for dysuria, flank pain and hematuria.;  GYN:  No vaginal bleeding, no vaginal discharge, no vulvar pain. ; Musculoskeletal: Negative for back pain and neck pain. Negative for swelling and trauma.; ; Skin: Negative for pruritus, rash, abrasions, blisters, bruising and skin lesion.; ; Neuro:  Negative for headache, lightheadedness and neck stiffness. Negative for weakness, altered level of consciousness , altered mental status, extremity weakness, paresthesias, involuntary movement, seizure and syncope.       Allergies  Review of patient's allergies indicates no known allergies.  Home Medications  No current outpatient prescriptions on file.  BP 123/88  Pulse 80  Temp(Src) 99.1 F (37.3 C) (Oral)  Resp 16  SpO2 99%  LMP 07/10/2011  Physical Exam 2115: Physical examination:  Nursing notes reviewed; Vital signs and O2 SAT reviewed;  Constitutional: Well developed, Well nourished, Well hydrated, In no acute distress; Head:  Normocephalic, atraumatic; Eyes: EOMI, PERRL, No scleral icterus; ENMT: Mouth and pharynx normal, Mucous membranes moist; Neck: Supple, Full range of motion, No lymphadenopathy; Cardiovascular: Regular rate and rhythm, No murmur or gallop; Respiratory: Breath sounds clear & equal bilaterally, No rales, rhonchi, wheezes, Normal respiratory effort/excursion; Chest: Nontender, Movement normal; Abdomen: Soft, Nontender, Nondistended, Normal bowel sounds; Genitourinary: No CVA tenderness; Pelvic exam performed with permission of pt and female ED tech assist during exam.  External genitalia w/o lesions. Vaginal vault with thick white discharge.  Cervix closed and w/o bleeding, no lesions, not friable, GC/chlam and wet prep obtained and sent to lab.  Bimanual exam w/o CMT, uterine or adnexal tenderness.;  Extremities: Pulses normal, No tenderness, No edema, No calf edema or asymmetry.; Neuro: AA&Ox3, Major CN grossly intact.  No gross focal motor or  sensory deficits in extremities.; Skin: Color normal, Warm, Dry, no rash.; Psych:  Tearful.   ED Course  Procedures   2200:  Pt informed of pregnancy.  Immediately tearful, asks "how did this happen?" and "why am I being punished like this?"  Pt endorses she does not use birth control, as her significant other "doesn't  like to wear condoms."  Chaplain was in to see pt.  Pt endorses to myself, ED RN, and now the Chaplain that she "just wants to kill herself" knowing that she is pregnant.  States that she "can't afford" another child.  Will have ACT/Social Worker eval pt after workup completed.  12:24 AM:  +UTI, UC pending.  Pt would not allow sonographer to perform transvaginal US, but likely gestational sac seen in endometrial cavity, no ectopic.  UDS and wet prep results pending.  ACT team called and informed of above, will come to eval pt.    1:10 AM:  Sign out to Dr. Hyacinth Meeker.  Needs UDS recollected, ACT team to eval.  If discharged, need tx for UTI and BV, f/u OB/GYN.    MDM  MDM Reviewed: nursing note and vitals Interpretation: labs and ultrasound     Results for orders placed during the hospital encounter of 07/24/11  CBC      Component Value Range   WBC 8.3  4.0 - 10.5 (K/uL)   RBC 4.20  3.87 - 5.11 (MIL/uL)   Hemoglobin 12.6  12.0 - 15.0 (g/dL)   HCT 16.1  09.6 - 04.5 (%)   MCV 87.6  78.0 - 100.0 (fL)   MCH 30.0  26.0 - 34.0 (pg)   MCHC 34.2  30.0 - 36.0 (g/dL)   RDW 40.9  81.1 - 91.4 (%)   Platelets 299  150 - 400 (K/uL)  DIFFERENTIAL      Component Value Range   Neutrophils Relative 59  43 - 77 (%)   Neutro Abs 4.9  1.7 - 7.7 (K/uL)   Lymphocytes Relative 33  12 - 46 (%)   Lymphs Abs 2.8  0.7 - 4.0 (K/uL)   Monocytes Relative 7  3 - 12 (%)   Monocytes Absolute 0.6  0.1 - 1.0 (K/uL)   Eosinophils Relative 0  0 - 5 (%)   Eosinophils Absolute 0.0  0.0 - 0.7 (K/uL)   Basophils Relative 0  0 - 1 (%)   Basophils Absolute 0.0  0.0 - 0.1 (K/uL)  COMPREHENSIVE METABOLIC PANEL      Component Value Range   Sodium 132 (*) 135 - 145 (mEq/L)   Potassium 3.2 (*) 3.5 - 5.1 (mEq/L)   Chloride 96  96 - 112 (mEq/L)   CO2 24  19 - 32 (mEq/L)   Glucose, Bld 90  70 - 99 (mg/dL)   BUN 12  6 - 23 (mg/dL)   Creatinine, Ser 7.82  0.50 - 1.10 (mg/dL)   Calcium 95.6  8.4 - 10.5 (mg/dL)   Total Protein  8.3  6.0 - 8.3 (g/dL)   Albumin 4.3  3.5 - 5.2 (g/dL)   AST 11  0 - 37 (U/L)   ALT 6  0 - 35 (U/L)   Alkaline Phosphatase 75  39 - 117 (U/L)   Total Bilirubin 0.3  0.3 - 1.2 (mg/dL)   GFR calc non Af Amer >90  >90 (mL/min)   GFR calc Af Amer >90  >90 (mL/min)  URINALYSIS, ROUTINE W REFLEX MICROSCOPIC      Component Value Range   Color,  Urine YELLOW  YELLOW    APPearance CLOUDY (*) CLEAR    Specific Gravity, Urine 1.034 (*) 1.005 - 1.030    pH 6.5  5.0 - 8.0    Glucose, UA NEGATIVE  NEGATIVE (mg/dL)   Hgb urine dipstick NEGATIVE  NEGATIVE    Bilirubin Urine NEGATIVE  NEGATIVE    Ketones, ur >80 (*) NEGATIVE (mg/dL)   Protein, ur NEGATIVE  NEGATIVE (mg/dL)   Urobilinogen, UA 1.0  0.0 - 1.0 (mg/dL)   Nitrite POSITIVE (*) NEGATIVE    Leukocytes, UA MODERATE (*) NEGATIVE   LIPASE, BLOOD      Component Value Range   Lipase 20  11 - 59 (U/L)  POCT PREGNANCY, URINE      Component Value Range   Preg Test, Ur POSITIVE (*) NEGATIVE   URINE MICROSCOPIC-ADD ON      Component Value Range   Squamous Epithelial / LPF MANY (*) RARE    WBC, UA TOO NUMEROUS TO COUNT  <3 (WBC/hpf)   RBC / HPF 3-6  <3 (RBC/hpf)   Bacteria, UA MANY (*) RARE    Urine-Other MUCOUS PRESENT    HCG, QUANTITATIVE, PREGNANCY      Component Value Range   hCG, Beta Chain, Quant, S 42270 (*) <5 (mIU/mL)  ABO/RH      Component Value Range   ABO/RH(D) B POS    ETHANOL      Component Value Range   Alcohol, Ethyl (B) <11  0 - 11 (mg/dL)   US Ob Comp Less 14 Wks 07/24/2011  *RADIOLOGY REPORT*  Clinical Data: Pelvic pain.  OBSTETRIC <14 WK ULTRASOUND  Technique:  Transabdominal ultrasound was performed for evaluation of the gestation as well as the maternal uterus and adnexal regions.  Comparison:  None.  Intrauterine gestational sac: Slightly irregular gestational sac. Yolk sac: None Embryo: None Cardiac Activity: None  MSD: 22 mm  7w  1d [place] EDC: 03/10/2012  Maternal uterus/Adnexae: No subchorionic hemorrhage.  The ovaries  were not visualized. Uterus is retroflexed.  The study is limited because the patient refused transvaginal exam and the bladder is empty and the uterus is retroflexed.  IMPRESSION: Suboptimally visualized irregular appearing area of lucency in the endometrial cavity probably representing a gestational sac.  No visible fetal pole.  Original Report Authenticated By: Gwynn Burly, M.D.       Laray Anger, DO 07/26/11 2339
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
## 2011-07-25 NOTE — ED Provider Notes (Signed)
12:47 PM  Discussed with Child psychotherapist, states patient is essentially clear from a psychiatric standpoint. She denies any thoughts to harm herself or any other people. She apparently does states she was upset when she found out. She was pregnant. Patient was reassessed in the room, and appears quite well. From an emotional and psychosocial standpoint. She was stable for outpatient followup.   However, her ultrasound was discussed with her. There was no yolk sac, no embryo, no cardiac activity.  This in the setting of a quantitative beta hCG of 42,000. Likely consistent with an abnormal or nonviable pregnancy. Will discuss with GYN and obtain further outpatient recommendations.    Discussed with Dr Estanislado Pandy.     Recommend f/u HCG on Thursday or Friday.  Recommended outpatient followup for repeat hCG testing and repeat ultrasound testing is needed.   All relevant information was given to the patient. Patient feels comfortable going home. She, understands she'll need to followup at [facility] in a few days. She also knows to return to the ED or call 911 if she should have any concerning symptoms such as increasing abdominal pain, vaginal bleeding, dizziness, or syncope.  She was told that this current pregnancy is likely nonviable.   No evidence for ectopic pregnancy at this time.  Patient was also cautioned to return to the ED for any depression or suicidal thoughts. At this time. She is smiling and jovial and appears responsible.  Patient is stable for discharge at this time. Instructions given. Patient voices understanding.           Taliya Mcclard A. Patrica Duel, MD 07/25/11 9147

## 2011-07-25 NOTE — ED Notes (Signed)
Patient resting quietly with eyes closed and deep/even respirations.  Offers no c/o at this time.
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
## 2011-07-25 NOTE — Progress Notes (Signed)
CSW provided patient with referral sheet for local LME and therapists in the area.  Patient appreciative of information. Expressed no additional needs at this time.  Ileene Hutchinson , MSW, LCSWA 07/25/2011 1:22 PM [phone]

## 2011-07-26 LAB — URINE CULTURE
Colony Count: >=100000
Culture  Setup Time: 201303040431

## 2011-07-27 NOTE — ED Notes (Signed)
+   Urine Patient treated with Keflex-sensitive to same-chart appended per protocol MD. 

## 2014-01-20 ENCOUNTER — Encounter (HOSPITAL_COMMUNITY): Payer: Self-pay | Admitting: Emergency Medicine

## 2014-01-20 ENCOUNTER — Emergency Department (HOSPITAL_COMMUNITY): Payer: Medicaid Other

## 2014-01-20 ENCOUNTER — Emergency Department (HOSPITAL_COMMUNITY)
Admission: EM | Admit: 2014-01-20 | Discharge: 2014-01-20 | Disposition: A | Discharge status: Home / Self Care | Payer: Medicaid Other | Attending: Emergency Medicine | Admitting: Emergency Medicine

## 2014-01-20 DIAGNOSIS — Z3202 Encounter for pregnancy test, result negative: Secondary | ICD-10-CM | POA: Diagnosis not present

## 2014-01-20 DIAGNOSIS — R110 Nausea: Secondary | ICD-10-CM | POA: Insufficient documentation

## 2014-01-20 DIAGNOSIS — F17200 Nicotine dependence, unspecified, uncomplicated: Secondary | ICD-10-CM | POA: Diagnosis not present

## 2014-01-20 DIAGNOSIS — Z87448 Personal history of other diseases of urinary system: Secondary | ICD-10-CM | POA: Diagnosis not present

## 2014-01-20 DIAGNOSIS — Z792 Long term (current) use of antibiotics: Secondary | ICD-10-CM | POA: Diagnosis not present

## 2014-01-20 DIAGNOSIS — M5489 Other dorsalgia: Secondary | ICD-10-CM

## 2014-01-20 DIAGNOSIS — M549 Dorsalgia, unspecified: Secondary | ICD-10-CM | POA: Diagnosis present

## 2014-01-20 DIAGNOSIS — Z87440 Personal history of urinary (tract) infections: Secondary | ICD-10-CM | POA: Diagnosis not present

## 2014-01-20 LAB — BASIC METABOLIC PANEL
Anion gap: 13 (ref 5–15)
BUN: 18 mg/dL (ref 6–23)
CO2: 23 mEq/L (ref 19–32)
Calcium: 9.7 mg/dL (ref 8.4–10.5)
Chloride: 101 mEq/L (ref 96–112)
Creatinine, Ser: 0.82 mg/dL (ref 0.50–1.10)
GFR calc Af Amer: >90 mL/min (ref >90)
GLUCOSE: 88 mg/dL (ref 70–99)
POTASSIUM: 4.2 meq/L (ref 3.7–5.3)
Sodium: 137 mEq/L (ref 137–147)

## 2014-01-20 LAB — CBC WITH DIFFERENTIAL/PLATELET
Basophils Absolute: 0 10*3/uL (ref 0.0–0.1)
Basophils Relative: 0 % (ref 0–1)
EOS ABS: 0 10*3/uL (ref 0.0–0.7)
EOS PCT: 0 % (ref 0–5)
HEMATOCRIT: 36.5 % (ref 36.0–46.0)
HEMOGLOBIN: 12.4 g/dL (ref 12.0–15.0)
LYMPHS ABS: 2.6 10*3/uL (ref 0.7–4.0)
Lymphocytes Relative: 34 % (ref 12–46)
MCH: 31.1 pg (ref 26.0–34.0)
MCHC: 34 g/dL (ref 30.0–36.0)
MCV: 91.5 fL (ref 78.0–100.0)
MONOS PCT: 7 % (ref 3–12)
Monocytes Absolute: 0.6 10*3/uL (ref 0.1–1.0)
Neutro Abs: 4.5 10*3/uL (ref 1.7–7.7)
Neutrophils Relative %: 59 % (ref 43–77)
Platelets: 265 10*3/uL (ref 150–400)
RBC: 3.99 MIL/uL (ref 3.87–5.11)
RDW: 12 % (ref 11.5–15.5)
WBC: 7.6 10*3/uL (ref 4.0–10.5)

## 2014-01-20 LAB — URINALYSIS, ROUTINE W REFLEX MICROSCOPIC
Bilirubin Urine: NEGATIVE
Glucose, UA: NEGATIVE mg/dL
Hgb urine dipstick: NEGATIVE
Ketones, ur: NEGATIVE mg/dL
LEUKOCYTES UA: NEGATIVE
Nitrite: NEGATIVE
PROTEIN: NEGATIVE mg/dL
Specific Gravity, Urine: 1.027 (ref 1.005–1.030)
UROBILINOGEN UA: 1 mg/dL (ref 0.0–1.0)
pH: 7 (ref 5.0–8.0)

## 2014-01-20 LAB — POC URINE PREG, ED: Preg Test, Ur: NEGATIVE

## 2014-01-20 MED ORDER — SODIUM CHLORIDE 0.9 % IV BOLUS (SEPSIS)
1000.0000 mL | Freq: Once | INTRAVENOUS | Status: AC
  Administered 2014-01-20: 1000 mL via INTRAVENOUS

## 2014-01-20 MED ORDER — IOHEXOL 350 MG/ML SOLN
80.0000 mL | Freq: Once | INTRAVENOUS | Status: AC | PRN
  Administered 2014-01-20: 50 mL via INTRAVENOUS

## 2014-01-20 MED ORDER — ONDANSETRON HCL 4 MG/2ML IJ SOLN
4.0000 mg | Freq: Once | INTRAMUSCULAR | Status: AC
  Administered 2014-01-20: 4 mg via INTRAVENOUS
  Filled 2014-01-20: qty 2

## 2014-01-20 MED ORDER — MORPHINE SULFATE 4 MG/ML IJ SOLN
4.0000 mg | Freq: Once | INTRAMUSCULAR | Status: AC
  Administered 2014-01-20: 4 mg via INTRAVENOUS
  Filled 2014-01-20: qty 1

## 2014-01-20 NOTE — ED Provider Notes (Signed)
CSN: [id number]     Arrival date & time 01/20/14  1335 History   First MD Initiated Contact with Patient 01/20/14 1542     Chief Complaint  Patient presents with  . Back Pain  . Abdominal Pain  . Leg Pain     (Consider location/radiation/quality/duration/timing/severity/associated sxs/prior Treatment) HPI Tammy Ray 33 y.o. with a pmh of UTI presents with right flank pain and LLE cramping. Right flank pain is sharp, non radiating, no known relieving factors. Similar to previous "kidney infections" in the past. LLE cramping has been ongoing in the past 2-3 days. No estrogen use. No long car/plane rides. No Family or personal history of VTE. No V/D, but some slight nausea with the pain. No vaginal dc or vaginal bleeding.   Past Medical History  Diagnosis Date  . UTI (lower urinary tract infection)   . Kidney infection    History reviewed. No pertinent past surgical history. No family history on file. History  Substance Use Topics  . Smoking status: Current Every Day Smoker  . Smokeless tobacco: Not on file  . Alcohol Use: Yes   OB History   Grav Para Term Preterm Abortions TAB SAB Ect Mult Living   5         3     Review of Systems  Gastrointestinal: Positive for abdominal pain.  Musculoskeletal: Positive for back pain.       Leg pain  All other systems reviewed and are negative.     Allergies  Review of patient's allergies indicates no known allergies.  Home Medications   Prior to Admission medications   Medication Sig Start Date End Date Taking? Authorizing Provider  HYDROcodone-acetaminophen (NORCO) 7.5-325 MG per tablet Take 1 tablet by mouth every 6 (six) hours as needed for moderate pain.   Yes Historical Provider, MD  penicillin v potassium (VEETID) 250 MG tablet Take 250 mg by mouth 4 (four) times daily.    Historical Provider, MD   BP 113/69  Pulse 73  Temp(Src) 98.2 F (36.8 C) (Oral)  Resp 20  Ht  (1.702 m)  Wt 140 lb (63.504 kg)  BMI 21.92  kg/m2  SpO2 100%  LMP 12/28/2013 Physical Exam  Constitutional: She is oriented to person, place, and time. She appears well-developed and well-nourished. No distress.  HENT:  Head: Normocephalic and atraumatic.  Right Ear: External ear normal.  Left Ear: External ear normal.  Eyes: Conjunctivae and EOM are normal. Right eye exhibits no discharge. Left eye exhibits no discharge.  Neck: Normal range of motion. Neck supple. No JVD present.  Cardiovascular: Normal rate, regular rhythm and normal heart sounds.  Exam reveals no gallop and no friction rub.   No murmur heard. Pulmonary/Chest: Effort normal and breath sounds normal. No stridor. No respiratory distress. She has no wheezes. She has no rales. She exhibits no tenderness.  Abdominal: Soft. Bowel sounds are normal. She exhibits no distension. There is no tenderness. There is no rebound and no guarding.  Musculoskeletal: Normal range of motion. She exhibits no edema.  Neurological: She is alert and oriented to person, place, and time.  Skin: Skin is warm. No rash noted. She is not diaphoretic.  Psychiatric: She has a normal mood and affect. Her behavior is normal.    ED Course  Procedures (including critical care time) Labs Review Labs Reviewed  URINE CULTURE  URINALYSIS, ROUTINE W REFLEX MICROSCOPIC  CBC WITH DIFFERENTIAL  BASIC METABOLIC PANEL  POC URINE PREG, ED  Imaging Review Ct Angio Chest Pe W/cm &/or Wo Cm  01/20/2014   CLINICAL DATA:  Pleuritic back pain, evaluate for PE  EXAM: CT ANGIOGRAPHY CHEST WITH CONTRAST  TECHNIQUE: Multidetector CT imaging of the chest was performed using the standard protocol during bolus administration of intravenous contrast. Multiplanar CT image reconstructions and MIPs were obtained to evaluate the vascular anatomy.  CONTRAST:  50mL OMNIPAQUE IOHEXOL 350 MG/ML SOLN  COMPARISON:  CTA chest dated 08/10/2010  FINDINGS: No evidence of pulmonary embolism.  Lungs are clear. No suspicious  pulmonary nodules. No pleural effusion or pneumothorax.  Visualized thyroid is unremarkable.  The heart is normal in size.  No pericardial effusion.  No suspicious mediastinal, hilar, or axillary lymphadenopathy.  Visualized upper abdomen is unremarkable.  Visualized osseous structures are within normal limits.  Review of the MIP images confirms the above findings.  IMPRESSION: No evidence of pulmonary embolism.  No evidence of acute cardiopulmonary disease.   Electronically Signed   By: Charline Bills M.D.   On: 01/20/2014 19:03     EKG Interpretation None      MDM   Final diagnoses:  Right-sided back pain, unspecified location    Pt presents with right flank pain that is pleuritic. No fever. UA neg for UTI. No LE edema or swelling or sig ttp. Given her pleuritic symptoms CTA PE performed and negative for PE. Likely musculoskeletal pain. Urine preg neg. No leukocytosis. No sig electrolyte abn. NSAIDS for pain. Strong return precautions given for worsening symptoms or any other alarming or concerning symptoms or issues. The patient was in agreement with the treatment plan and I answered all of their questions. The patient was stable for dc. At dc, the patient ambulated without difficulty, was moving all four extremities, symptoms improved, NAD. and AOx4 Care discussed with my attending, Dr. Jodi Mourning. If performed and available, imaging studies and labs reviewed.      Sena Hitch, MD 01/20/14 2351

## 2014-01-20 NOTE — Discharge Instructions (Signed)
Back Pain, Adult Low back pain is very common. About 1 in 5 people have back pain.The cause of low back pain is rarely dangerous. The pain often gets better over time.About half of people with a sudden onset of back pain feel better in just 2 weeks. About 8 in 10 people feel better by 6 weeks.  CAUSES Some common causes of back pain include:  Strain of the muscles or ligaments supporting the spine.  Wear and tear (degeneration) of the spinal discs.  Arthritis.  Direct injury to the back. DIAGNOSIS Most of the time, the direct cause of low back pain is not known.However, back pain can be treated effectively even when the exact cause of the pain is unknown.Answering your caregiver's questions about your overall health and symptoms is one of the most accurate ways to make sure the cause of your pain is not dangerous. If your caregiver needs more information, he or she may order lab work or imaging tests (X-rays or MRIs).However, even if imaging tests show changes in your back, this usually does not require surgery. HOME CARE INSTRUCTIONS For many people, back pain returns.Since low back pain is rarely dangerous, it is often a condition that people can learn to manageon their own.   Remain active. It is stressful on the back to sit or stand in one place. Do not sit, drive, or stand in one place for more than 30 minutes at a time. Take short walks on level surfaces as soon as pain allows.Try to increase the length of time you walk each day.  Do not stay in bed.Resting more than 1 or 2 days can delay your recovery.  Do not avoid exercise or work.Your body is made to move.It is not dangerous to be active, even though your back may hurt.Your back will likely heal faster if you return to being active before your pain is gone.  Pay attention to your body when you bend and lift. Many people have less discomfortwhen lifting if they bend their knees, keep the load close to their bodies,and  avoid twisting. Often, the most comfortable positions are those that put less stress on your recovering back.  Find a comfortable position to sleep. Use a firm mattress and lie on your side with your knees slightly bent. If you lie on your back, put a pillow under your knees.  Only take over-the-counter or prescription medicines as directed by your caregiver. Over-the-counter medicines to reduce pain and inflammation are often the most helpful.Your caregiver may prescribe muscle relaxant drugs.These medicines help dull your pain so you can more quickly return to your normal activities and healthy exercise.  Put ice on the injured area.  Put ice in a plastic bag.  Place a towel between your skin and the bag.  Leave the ice on for 15-20 minutes, 03-04 times a day for the first 2 to 3 days. After that, ice and heat may be alternated to reduce pain and spasms.  Ask your caregiver about trying back exercises and gentle massage. This may be of some benefit.  Avoid feeling anxious or stressed.Stress increases muscle tension and can worsen back pain.It is important to recognize when you are anxious or stressed and learn ways to manage it.Exercise is a great option. SEEK MEDICAL CARE IF:  You have pain that is not relieved with rest or medicine.  You have pain that does not improve in 1 week.  You have new symptoms.  You are generally not feeling well. SEEK   IMMEDIATE MEDICAL CARE IF:   You have pain that radiates from your back into your legs.  You develop new bowel or bladder control problems.  You have unusual weakness or numbness in your arms or legs.  You develop nausea or vomiting.  You develop abdominal pain.  You feel faint. Document Released: 05/09/2005 Document Revised: 11/08/2011 Document Reviewed: 09/10/2013 [employer] Patient Information 2015 [employer]. This information is not intended to replace advice given to you by your health care provider. Make sure you  discuss any questions you have with your health care provider.  

## 2014-01-20 NOTE — ED Provider Notes (Signed)
Medical screening examination/treatment/procedure(s) were conducted as a shared visit with non-physician practitioner(s) or resident  and myself.  I personally evaluated the patient during the encounter and agree with the findings.   I have personally reviewed any xrays and/ or EKG's with the provider and I agree with interpretation.     Enid Skeens, MD 01/20/14 [phone]

## 2014-01-20 NOTE — ED Notes (Signed)
Patient states has had R back pain, R abdominal pain and L leg pain x 4 days.   Patient states has been nauseated, but denies vomiting or diarrhea.   Patient states "it's hard to pee".

## 2014-01-21 LAB — URINE CULTURE
Colony Count: NO GROWTH
Culture: NO GROWTH

## 2014-01-31 ENCOUNTER — Ambulatory Visit: Payer: Medicaid Other | Attending: Internal Medicine | Admitting: Internal Medicine

## 2014-01-31 ENCOUNTER — Ambulatory Visit (HOSPITAL_COMMUNITY)
Admission: RE | Admit: 2014-01-31 | Discharge: 2014-01-31 | Disposition: A | Discharge status: Home / Self Care | Payer: Medicaid Other | Source: Ambulatory Visit | Attending: Internal Medicine | Admitting: Internal Medicine

## 2014-01-31 ENCOUNTER — Encounter: Payer: Self-pay | Admitting: Internal Medicine

## 2014-01-31 VITALS — BP 129/85 | HR 86 | Temp 98.6°F | Resp 16 | Ht 66.5 in | Wt 136.0 lb

## 2014-01-31 DIAGNOSIS — Z3049 Encounter for surveillance of other contraceptives: Secondary | ICD-10-CM | POA: Diagnosis not present

## 2014-01-31 DIAGNOSIS — M545 Low back pain, unspecified: Secondary | ICD-10-CM | POA: Diagnosis not present

## 2014-01-31 DIAGNOSIS — Z23 Encounter for immunization: Secondary | ICD-10-CM | POA: Insufficient documentation

## 2014-01-31 DIAGNOSIS — F17200 Nicotine dependence, unspecified, uncomplicated: Secondary | ICD-10-CM | POA: Insufficient documentation

## 2014-01-31 DIAGNOSIS — Z3009 Encounter for other general counseling and advice on contraception: Secondary | ICD-10-CM | POA: Diagnosis not present

## 2014-01-31 LAB — POCT URINE PREGNANCY: PREG TEST UR: NEGATIVE

## 2014-01-31 MED ORDER — IBUPROFEN 600 MG PO TABS: 600.0000 mg | ORAL_TABLET | Freq: Three times a day (TID) | ORAL | Status: DC | PRN

## 2014-01-31 MED ORDER — MEDROXYPROGESTERONE ACETATE 150 MG/ML IM SUSP
150.0000 mg | Freq: Once | INTRAMUSCULAR | Status: AC
  Administered 2014-01-31: 150 mg via INTRAMUSCULAR

## 2014-01-31 NOTE — Patient Instructions (Signed)
It is very important that you stop smoking to decrease your chances of blood clot formation while on this medication, as discussed during the visit  Medroxyprogesterone injection [Contraceptive] What is this medicine? MEDROXYPROGESTERONE (me DROX ee proe JES te rone) contraceptive injections prevent pregnancy. They provide effective birth control for 3 months. Depo-subQ Provera 104 is also used for treating pain related to endometriosis. This medicine may be used for other purposes; ask your health care provider or pharmacist if you have questions. COMMON BRAND NAME(S): Depo-Provera, Depo-subQ Provera 104 What should I tell my health care provider before I take this medicine? They need to know if you have any of these conditions: -frequently drink alcohol -asthma -blood vessel disease or a history of a blood clot in the lungs or legs -bone disease such as osteoporosis -breast cancer -diabetes -eating disorder (anorexia nervosa or bulimia) -high blood pressure -HIV infection or AIDS -kidney disease -liver disease -mental depression -migraine -seizures (convulsions) -stroke -tobacco smoker -vaginal bleeding -an unusual or allergic reaction to medroxyprogesterone, other hormones, medicines, foods, dyes, or preservatives -pregnant or trying to get pregnant -breast-feeding How should I use this medicine? Depo-Provera Contraceptive injection is given into a muscle. Depo-subQ Provera 104 injection is given under the skin. These injections are given by a health care professional. You must not be pregnant before getting an injection. The injection is usually given during the first 5 days after the start of a menstrual period or 6 weeks after delivery of a baby. Talk to your pediatrician regarding the use of this medicine in children. Special care may be needed. These injections have been used in female children who have started having menstrual periods. Overdosage: If you think you have taken  too much of this medicine contact a poison control center or emergency room at once. NOTE: This medicine is only for you. Do not share this medicine with others. What if I miss a dose? Try not to miss a dose. You must get an injection once every 3 months to maintain birth control. If you cannot keep an appointment, call and reschedule it. If you wait longer than 13 weeks between Depo-Provera contraceptive injections or longer than 14 weeks between Depo-subQ Provera 104 injections, you could get pregnant. Use another method for birth control if you miss your appointment. You may also need a pregnancy test before receiving another injection. What may interact with this medicine? Do not take this medicine with any of the following medications: -bosentan This medicine may also interact with the following medications: -aminoglutethimide -antibiotics or medicines for infections, especially rifampin, rifabutin, rifapentine, and griseofulvin -aprepitant -barbiturate medicines such as phenobarbital or primidone -bexarotene -carbamazepine -medicines for seizures like ethotoin, felbamate, oxcarbazepine, phenytoin, topiramate -modafinil -St. John's wort This list may not describe all possible interactions. Give your health care provider a list of all the medicines, herbs, non-prescription drugs, or dietary supplements you use. Also tell them if you smoke, drink alcohol, or use illegal drugs. Some items may interact with your medicine. What should I watch for while using this medicine? This drug does not protect you against HIV infection (AIDS) or other sexually transmitted diseases. Use of this product may cause you to lose calcium from your bones. Loss of calcium may cause weak bones (osteoporosis). Only use this product for more than 2 years if other forms of birth control are not right for you. The longer you use this product for birth control the more likely you will be at risk for weak bones. Ask  your  health care professional how you can keep strong bones. You may have a change in bleeding pattern or irregular periods. Many females stop having periods while taking this drug. If you have received your injections on time, your chance of being pregnant is very low. If you think you may be pregnant, see your health care professional as soon as possible. Tell your health care professional if you want to get pregnant within the next year. The effect of this medicine may last a long time after you get your last injection. What side effects may I notice from receiving this medicine? Side effects that you should report to your doctor or health care professional as soon as possible: -allergic reactions like skin rash, itching or hives, swelling of the face, lips, or tongue -breast tenderness or discharge -breathing problems -changes in vision -depression -feeling faint or lightheaded, falls -fever -pain in the abdomen, chest, groin, or leg -problems with balance, talking, walking -unusually weak or tired -yellowing of the eyes or skin Side effects that usually do not require medical attention (report to your doctor or health care professional if they continue or are bothersome): -acne -fluid retention and swelling -headache -irregular periods, spotting, or absent periods -temporary pain, itching, or skin reaction at site where injected -weight gain This list may not describe all possible side effects. Call your doctor for medical advice about side effects. You may report side effects to FDA at 1-800-FDA-1088. Where should I keep my medicine? This does not apply. The injection will be given to you by a health care professional. NOTE: This sheet is a summary. It may not cover all possible information. If you have questions about this medicine, talk to your doctor, pharmacist, or health care provider.  2015, [employer] Standard. (2008-05-30 18:37:56)  Smoking Cessation Quitting smoking is  important to your health and has many advantages. However, it is not always easy to quit since nicotine is a very addictive drug. Oftentimes, people try 3 times or more before being able to quit. This document explains the best ways for you to prepare to quit smoking. Quitting takes hard work and a lot of effort, but you can do it. ADVANTAGES OF QUITTING SMOKING  You will live longer, feel better, and live better.  Your body will feel the impact of quitting smoking almost immediately.  Within 20 minutes, blood pressure decreases. Your pulse returns to its normal level.  After 8 hours, carbon monoxide levels in the blood return to normal. Your oxygen level increases.  After 24 hours, the chance of having a heart attack starts to decrease. Your breath, hair, and body stop smelling like smoke.  After 48 hours, damaged nerve endings begin to recover. Your sense of taste and smell improve.  After 72 hours, the body is virtually free of nicotine. Your bronchial tubes relax and breathing becomes easier.  After 2 to 12 weeks, lungs can hold more air. Exercise becomes easier and circulation improves.  The risk of having a heart attack, stroke, cancer, or lung disease is greatly reduced.  After 1 year, the risk of coronary heart disease is cut in half.  After 5 years, the risk of stroke falls to the same as a nonsmoker.  After 10 years, the risk of lung cancer is cut in half and the risk of other cancers decreases significantly.  After 15 years, the risk of coronary heart disease drops, usually to the level of a nonsmoker.  If you are pregnant, quitting smoking will  improve your chances of having a healthy baby.  The people you live with, especially any children, will be healthier.  You will have extra money to spend on things other than cigarettes. QUESTIONS TO THINK ABOUT BEFORE ATTEMPTING TO QUIT You may want to talk about your answers with your health care provider.  Why do you want to  quit?  If you tried to quit in the past, what helped and what did not?  What will be the most difficult situations for you after you quit? How will you plan to handle them?  Who can help you through the tough times? Your family? Friends? A health care provider?  What pleasures do you get from smoking? What ways can you still get pleasure if you quit? Here are some questions to ask your health care provider:  How can you help me to be successful at quitting?  What medicine do you think would be best for me and how should I take it?  What should I do if I need more help?  What is smoking withdrawal like? How can I get information on withdrawal? GET READY  Set a quit date.  Change your environment by getting rid of all cigarettes, ashtrays, matches, and lighters in your home, car, or work. Do not let people smoke in your home.  Review your past attempts to quit. Think about what worked and what did not. GET SUPPORT AND ENCOURAGEMENT You have a better chance of being successful if you have help. You can get support in many ways.  Tell your family, friends, and coworkers that you are going to quit and need their support. Ask them not to smoke around you.  Get individual, group, or telephone counseling and support. Programs are available at [employer] and health centers. Call your local health department for information about programs in your area.  Spiritual beliefs and practices may help some smokers quit.  Download a "quit meter" on your computer to keep track of quit statistics, such as how long you have gone without smoking, cigarettes not smoked, and money saved.  Get a self-help book about quitting smoking and staying off tobacco. LEARN NEW SKILLS AND BEHAVIORS  Distract yourself from urges to smoke. Talk to someone, go for a walk, or occupy your time with a task.  Change your normal routine. Take a different route to work. Drink tea instead of coffee. Eat breakfast in  a different place.  Reduce your stress. Take a hot bath, exercise, or read a book.  Plan something enjoyable to do every day. Reward yourself for not smoking.  Explore interactive web-based programs that specialize in helping you quit. GET MEDICINE AND USE IT CORRECTLY Medicines can help you stop smoking and decrease the urge to smoke. Combining medicine with the above behavioral methods and support can greatly increase your chances of successfully quitting smoking.  Nicotine replacement therapy helps deliver nicotine to your body without the negative effects and risks of smoking. Nicotine replacement therapy includes nicotine gum, lozenges, inhalers, nasal sprays, and skin patches. Some may be available over-the-counter and others require a prescription.  Antidepressant medicine helps people abstain from smoking, but how this works is unknown. This medicine is available by prescription.  Nicotinic receptor partial agonist medicine simulates the effect of nicotine in your brain. This medicine is available by prescription. Ask your health care provider for advice about which medicines to use and how to use them based on your health history. Your health care provider will  tell you what side effects to look out for if you choose to be on a medicine or therapy. Carefully read the information on the package. Do not use any other product containing nicotine while using a nicotine replacement product.  RELAPSE OR DIFFICULT SITUATIONS Most relapses occur within the first 3 months after quitting. Do not be discouraged if you start smoking again. Remember, most people try several times before finally quitting. You may have symptoms of withdrawal because your body is used to nicotine. You may crave cigarettes, be irritable, feel very hungry, cough often, get headaches, or have difficulty concentrating. The withdrawal symptoms are only temporary. They are strongest when you first quit, but they will go away  within 10-14 days. To reduce the chances of relapse, try to:  Avoid drinking alcohol. Drinking lowers your chances of successfully quitting.  Reduce the amount of caffeine you consume. Once you quit smoking, the amount of caffeine in your body increases and can give you symptoms, such as a rapid heartbeat, sweating, and anxiety.  Avoid smokers because they can make you want to smoke.  Do not let weight gain distract you. Many smokers will gain weight when they quit, usually less than 10 pounds. Eat a healthy diet and stay active. You can always lose the weight gained after you quit.  Find ways to improve your mood other than smoking. FOR MORE INFORMATION  www.smokefree.gov  Document Released: 05/03/2001 Document Revised: 09/23/2013 Document Reviewed: 08/18/2011 [facility] Patient Information 2015 [place], [place]. This information is not intended to replace advice given to you by your health care provider. Make sure you discuss any questions you have with your health care provider.

## 2014-01-31 NOTE — Progress Notes (Signed)
Patient ID: Tammy Ray, female   DOB: 1981-02-10, 33 y.o.   MRN: [id number]  [id number]  DOB - [birth year]  CC:  Chief Complaint  Patient presents with  . Establish Care       HPI: Tammy Ray is a 33 y.o. female here today to establish medical care.  Patient presents to clinic today as a ED follow up for right flank pain.  She states that everything was negative in the ED and she was sent home with pain medication.  She reports the pain has improved since then.  Last pap smear was last year and it was normal.  She reports that she has had thoracic spine pain since she was a child.  The pain is described as achy in nature. She has tried tylenol in the past for the pain with some relief.  She denies any bowel or bladder dysfunction. She reports pain on her right middle finger close to the nail.  She denies redness or warmth of the finger.     No Known Allergies Past Medical History  Diagnosis Date  . UTI (lower urinary tract infection)   . Kidney infection    Current Outpatient Prescriptions on File Prior to Visit  Medication Sig Dispense Refill  . HYDROcodone-acetaminophen (NORCO) 7.5-325 MG per tablet Take 1 tablet by mouth every 6 (six) hours as needed for moderate pain.      Marland Kitchen penicillin v potassium (VEETID) 250 MG tablet Take 250 mg by mouth 4 (four) times daily.       No current facility-administered medications on file prior to visit.   Family History  Problem Relation Age of Onset  . Cancer Maternal Aunt   . Cancer Maternal Grandmother    History   Social History  . Marital Status: Single    Spouse Name: N/A    Number of Children: N/A  . Years of Education: N/A   Occupational History  . Not on file.   Social History Main Topics  . Smoking status: Current Every Day Smoker  . Smokeless tobacco: Not on file  . Alcohol Use: Yes  . Drug Use: Not on file  . Sexual Activity: Not on file   Other Topics Concern  . Not on file   Social  History Narrative  . No narrative on file    Review of Systems  Constitutional: Positive for malaise/fatigue. Negative for fever, chills and weight loss.  Eyes: Negative.   Respiratory: Negative.   Cardiovascular: Negative for palpitations, claudication and leg swelling.  Gastrointestinal: Negative.   Genitourinary: Negative.  Negative for flank pain.  Musculoskeletal: Positive for back pain. Negative for falls, joint pain and neck pain.  Neurological: Positive for dizziness and weakness. Negative for tingling, tremors and headaches.  Endo/Heme/Allergies: Negative.       Objective:   Filed Vitals:   01/31/14 1508  BP: 129/85  Pulse: 86  Temp: 98.6 F (37 C)  Resp: 16    Physical Exam  Vitals reviewed. Constitutional: She is oriented to person, place, and time. No distress.  HENT:  Right Ear: External ear normal.  Left Ear: External ear normal.  Mouth/Throat: Oropharynx is clear and moist.  Eyes: Conjunctivae and EOM are normal. Pupils are equal, round, and reactive to light.  Neck: Normal range of motion. Neck supple.  Cardiovascular: Normal rate, regular rhythm and normal heart sounds.   Pulmonary/Chest: Effort normal and breath sounds normal.  Abdominal: Soft. Bowel sounds are normal.  Musculoskeletal: Normal range of motion.  Neurological: She is alert and oriented to person, place, and time. She has normal reflexes.  Skin: Skin is warm and dry.  Psychiatric: She has a normal mood and affect. Thought content normal.     Lab Results  Component Value Date   WBC 7.6 01/20/2014   HGB 12.4 01/20/2014   HCT 36.5 01/20/2014   MCV 91.5 01/20/2014   PLT 265 01/20/2014   Lab Results  Component Value Date   CREATININE 0.82 01/20/2014   BUN 18 01/20/2014   NA 137 01/20/2014   K 4.2 01/20/2014   CL 101 01/20/2014   CO2 23 01/20/2014    No results found for this basename: HGBA1C   Lipid Panel  No results found for this basename: chol, trig, hdl, cholhdl, vldl, ldlcalc         Assessment and plan:   Tammy Ray was seen today for establish care.  Diagnoses and associated orders for this visit:  Midline low back pain without sciatica - DG Lumbar Spine Complete; Future - ibuprofen (ADVIL,MOTRIN) 600 MG tablet; Take 1 tablet (600 mg total) by mouth every 8 (eight) hours as needed.  Counseling for initiation of birth control method - POCT urine pregnancy-negative  - medroxyPROGESTERone (DEPO-PROVERA) injection 150 mg; Inject 1 mL (150 mg total) into the muscle once.  Explained all risk associated with medication. Strongly advised patient to quit smoking especially for the risk of blood clots.    Need for prophylactic vaccination and inoculation against influenza received   Return in about 3 months (around 05/02/2014) for DEPO injection .  The patient was given clear instructions to go to ER or return to medical center if symptoms don't improve, worsen or new problems develop. The patient verbalized understanding.   Holland Commons, NP-C [facility] [phone] 02/17/2014, 3:15 PM

## 2014-01-31 NOTE — Progress Notes (Signed)
Pt is here to establish care. ED following up after kidney pain on her right side. Pt's right hand middle finger is very tender at the end of her nail. Pt suffers from chronic back pain.

## 2014-02-03 ENCOUNTER — Telehealth: Payer: Self-pay | Admitting: Internal Medicine

## 2014-02-03 NOTE — Telephone Encounter (Signed)
Pt called stating that she received the  medroxyPROGESTERone (DEPO-PROVERA) injection 150 mg on 01/31/2014 and was told that if she started having different symptoms to let the Dr. Ashley Mariner. Pt states that her breast are tender and is having discharge. Please f/u with pt.

## 2014-02-04 ENCOUNTER — Encounter: Payer: Self-pay | Admitting: Internal Medicine

## 2014-02-04 ENCOUNTER — Other Ambulatory Visit (HOSPITAL_COMMUNITY)
Admission: RE | Admit: 2014-02-04 | Discharge: 2014-02-04 | Disposition: A | Discharge status: Home / Self Care | Payer: Medicaid Other | Source: Ambulatory Visit | Attending: Internal Medicine | Admitting: Internal Medicine

## 2014-02-04 ENCOUNTER — Ambulatory Visit: Payer: Medicaid Other | Attending: Internal Medicine | Admitting: Internal Medicine

## 2014-02-04 VITALS — BP 122/85 | HR 79 | Temp 99.4°F | Resp 16

## 2014-02-04 DIAGNOSIS — F17200 Nicotine dependence, unspecified, uncomplicated: Secondary | ICD-10-CM | POA: Diagnosis not present

## 2014-02-04 DIAGNOSIS — N644 Mastodynia: Secondary | ICD-10-CM | POA: Insufficient documentation

## 2014-02-04 DIAGNOSIS — N898 Other specified noninflammatory disorders of vagina: Secondary | ICD-10-CM | POA: Diagnosis not present

## 2014-02-04 DIAGNOSIS — G43909 Migraine, unspecified, not intractable, without status migrainosus: Secondary | ICD-10-CM | POA: Diagnosis not present

## 2014-02-04 DIAGNOSIS — N760 Acute vaginitis: Secondary | ICD-10-CM | POA: Diagnosis present

## 2014-02-04 DIAGNOSIS — K5900 Constipation, unspecified: Secondary | ICD-10-CM | POA: Diagnosis not present

## 2014-02-04 DIAGNOSIS — Z113 Encounter for screening for infections with a predominantly sexual mode of transmission: Secondary | ICD-10-CM | POA: Insufficient documentation

## 2014-02-04 DIAGNOSIS — M79609 Pain in unspecified limb: Secondary | ICD-10-CM | POA: Insufficient documentation

## 2014-02-04 NOTE — Telephone Encounter (Signed)
Pt notified breast tenderness is side effect of Depo inj,  Pt will make apt to see PCP for discharge

## 2014-02-04 NOTE — Progress Notes (Signed)
Patient ID: Tammy Ray, female   DOB: 1981/02/02, 33 y.o.   MRN: [id number]  CC: follow up  HPI:  Patient presents to clinic today with c/o pain at depo injection site.  She recently received a depo-provera injection and the influenza.  She states that she is now having headaches and breast tenderness.  She also c/o of vaginal discharge for two days that is white and thick with no associated odor. She denies any recent exposures to STD's, lesions, or itch.   No Known Allergies Past Medical History  Diagnosis Date  . UTI (lower urinary tract infection)   . Kidney infection    Current Outpatient Prescriptions on File Prior to Visit  Medication Sig Dispense Refill  . HYDROcodone-acetaminophen (NORCO) 7.5-325 MG per tablet Take 1 tablet by mouth every 6 (six) hours as needed for moderate pain.      Marland Kitchen ibuprofen (ADVIL,MOTRIN) 600 MG tablet Take 1 tablet (600 mg total) by mouth every 8 (eight) hours as needed.  30 tablet  1  . penicillin v potassium (VEETID) 250 MG tablet Take 250 mg by mouth 4 (four) times daily.       No current facility-administered medications on file prior to visit.   Family History  Problem Relation Age of Onset  . Cancer Maternal Aunt   . Cancer Maternal Grandmother    History   Social History  . Marital Status: Single    Spouse Name: N/A    Number of Children: N/A  . Years of Education: N/A   Occupational History  . Not on file.   Social History Main Topics  . Smoking status: Current Every Day Smoker  . Smokeless tobacco: Not on file  . Alcohol Use: Yes  . Drug Use: Not on file  . Sexual Activity: Not on file   Other Topics Concern  . Not on file   Social History Narrative  . No narrative on file    Review of Systems: See HPI    Objective:   Filed Vitals:   02/04/14 1732  BP: 122/85  Pulse: 79  Temp: 99.4 F (37.4 C)  Resp: 16    Physical Exam  Cardiovascular: Normal rate, regular rhythm and normal heart sounds.    Pulmonary/Chest: Effort normal and breath sounds normal. Right breast exhibits tenderness. Right breast exhibits no mass. Left breast exhibits tenderness. Left breast exhibits no mass.  Genitourinary: Right adnexum displays no tenderness. Left adnexum displays no tenderness. No vaginal discharge found.  Lymphadenopathy:       Right: No inguinal adenopathy present.       Left: No inguinal adenopathy present.  Skin:        Lab Results  Component Value Date   WBC 7.6 01/20/2014   HGB 12.4 01/20/2014   HCT 36.5 01/20/2014   MCV 91.5 01/20/2014   PLT 265 01/20/2014   Lab Results  Component Value Date   CREATININE 0.82 01/20/2014   BUN 18 01/20/2014   NA 137 01/20/2014   K 4.2 01/20/2014   CL 101 01/20/2014   CO2 23 01/20/2014    No results found for this basename: HGBA1C   Lipid Panel  No results found for this basename: chol, trig, hdl, cholhdl, vldl, ldlcalc       Assessment and plan:   Tammy Ray was seen today for follow-up.  Diagnoses and associated orders for this visit:  Vaginal discharge - Cervicovaginal ancillary only  Breast tenderness Likely from hormones of Depo injection  Holland Commons, NP-C [facility] [phone] 02/04/2014, 5:45 PM

## 2014-02-04 NOTE — Progress Notes (Signed)
Pt is here today because she feels like she may be having side effects from her DEPO injection. Pt c/o a soar tender feeling under her right arm pit. Pt is also having migraines with constipation.

## 2014-02-13 ENCOUNTER — Telehealth: Payer: Self-pay | Admitting: Internal Medicine

## 2014-02-13 NOTE — Telephone Encounter (Signed)
Pt called back because she received a phone call from the nurse. Please call pt back with results.

## 2014-02-14 ENCOUNTER — Telehealth: Payer: Self-pay | Admitting: Emergency Medicine

## 2014-02-14 MED ORDER — METRONIDAZOLE 500 MG PO TABS: 500.0000 mg | ORAL_TABLET | Freq: Two times a day (BID) | ORAL | Status: DC

## 2014-02-14 NOTE — Telephone Encounter (Signed)
Left message for pt to call for lab results Medication e-scribed to [facility] pharmacy 

## 2014-02-17 ENCOUNTER — Telehealth: Payer: Self-pay | Admitting: Emergency Medicine

## 2014-02-17 NOTE — Telephone Encounter (Signed)
Pt called in for lab results. Pt given positive BV results with instructions to start taking prescribed Flagyl 500 mg tab BID x 7 days Pt also informed to refrain from alcohol while taking medication. Medication e-scribed to [facility] pharmacy

## 2014-03-24 ENCOUNTER — Encounter: Payer: Self-pay | Admitting: Internal Medicine

## 2014-04-28 ENCOUNTER — Ambulatory Visit: Payer: Medicaid Other | Attending: Internal Medicine | Admitting: Internal Medicine

## 2014-04-28 ENCOUNTER — Encounter: Payer: Self-pay | Admitting: Internal Medicine

## 2014-04-28 VITALS — BP 117/83 | HR 73 | Temp 98.5°F | Resp 16 | Ht 67.0 in | Wt 154.0 lb

## 2014-04-28 DIAGNOSIS — F17200 Nicotine dependence, unspecified, uncomplicated: Secondary | ICD-10-CM | POA: Diagnosis not present

## 2014-04-28 DIAGNOSIS — Z0000 Encounter for general adult medical examination without abnormal findings: Secondary | ICD-10-CM | POA: Diagnosis not present

## 2014-04-28 DIAGNOSIS — Z3042 Encounter for surveillance of injectable contraceptive: Secondary | ICD-10-CM | POA: Diagnosis not present

## 2014-04-28 DIAGNOSIS — Z309 Encounter for contraceptive management, unspecified: Secondary | ICD-10-CM | POA: Diagnosis not present

## 2014-04-28 MED ORDER — MEDROXYPROGESTERONE ACETATE 150 MG/ML IM SUSP
150.0000 mg | Freq: Once | INTRAMUSCULAR | Status: AC
Start: 2014-04-28 — End: 2014-04-28
  Administered 2014-04-28: 150 mg via INTRAMUSCULAR

## 2014-04-28 NOTE — Progress Notes (Signed)
Patient ID: Tammy Ray, female   DOB: 09/25/1980, 33 y.o.   MRN: [id number]  CC:  Depo injection  HPI:  Patient presents to clinic today for a Depo injections.  Patient reports that she has gained almost 20 pounds since her first injection.   She has stopped having cycles since the injections.  She has no c/o today.    No Known Allergies Past Medical History  Diagnosis Date  . UTI (lower urinary tract infection)   . Kidney infection    Current Outpatient Prescriptions on File Prior to Visit  Medication Sig Dispense Refill  . HYDROcodone-acetaminophen (NORCO) 7.5-325 MG per tablet Take 1 tablet by mouth every 6 (six) hours as needed for moderate pain.    Marland Kitchen. ibuprofen (ADVIL,MOTRIN) 600 MG tablet Take 1 tablet (600 mg total) by mouth every 8 (eight) hours as needed. (Patient not taking: Reported on 04/28/2014) 30 tablet 1  . metroNIDAZOLE (FLAGYL) 500 MG tablet Take 1 tablet (500 mg total) by mouth 2 (two) times daily. (Patient not taking: Reported on 04/28/2014) 14 tablet 0  . penicillin v potassium (VEETID) 250 MG tablet Take 250 mg by mouth 4 (four) times daily.     No current facility-administered medications on file prior to visit.   Family History  Problem Relation Age of Onset  . Cancer Maternal Aunt   . Cancer Maternal Grandmother    History   Social History  . Marital Status: Single    Spouse Name: N/A    Number of Children: N/A  . Years of Education: N/A   Occupational History  . Not on file.   Social History Main Topics  . Smoking status: Current Every Day Smoker  . Smokeless tobacco: Not on file  . Alcohol Use: Yes  . Drug Use: Not on file  . Sexual Activity: Not on file   Other Topics Concern  . Not on file   Social History Narrative    Review of Systems  All other systems reviewed and are negative.     Objective:   Filed Vitals:   04/28/14 1459  BP: 117/83  Pulse: 73  Temp: 98.5 F (36.9 C)  Resp: 16    Physical Exam  Cardiovascular:  Normal rate, regular rhythm and normal heart sounds.   Pulmonary/Chest: Effort normal and breath sounds normal.     Lab Results  Component Value Date   WBC 7.6 01/20/2014   HGB 12.4 01/20/2014   HCT 36.5 01/20/2014   MCV 91.5 01/20/2014   PLT 265 01/20/2014   Lab Results  Component Value Date   CREATININE 0.82 01/20/2014   BUN 18 01/20/2014   NA 137 01/20/2014   K 4.2 01/20/2014   CL 101 01/20/2014   CO2 23 01/20/2014    No results found for: HGBA1C Lipid Panel  No results found for: CHOL, TRIG, HDL, CHOLHDL, VLDL, LDLCALC     Assessment and plan:   Kari was seen today for follow-up.  Diagnoses and associated orders for this visit:  Preventative health care - medroxyPROGESTERone (DEPO-PROVERA) injection 150 mg; Inject 1 mL (150 mg total) into the muscle once. Encounter for contraceptive management, unspecified encounter Injection given  Return for Nurse Visit-Depo injection b/w 2/22-3/8.        Holland CommonsKECK, VALERIE, NP-C [facility] [phone] 04/28/2014, 3:09 PM

## 2014-04-28 NOTE — Progress Notes (Signed)
Pt is here for a DEPO shot only. Pt has no C/O today.

## 2014-04-28 NOTE — Patient Instructions (Signed)

## 2014-07-21 ENCOUNTER — Ambulatory Visit: Payer: Medicaid Other | Attending: Internal Medicine | Admitting: *Deleted

## 2014-07-21 DIAGNOSIS — Z3042 Encounter for surveillance of injectable contraceptive: Secondary | ICD-10-CM | POA: Diagnosis present

## 2014-07-21 MED ORDER — MEDROXYPROGESTERONE ACETATE 150 MG/ML IM SUSP
150.0000 mg | Freq: Once | INTRAMUSCULAR | Status: AC
  Administered 2014-07-21: 150 mg via INTRAMUSCULAR

## 2014-07-21 NOTE — Progress Notes (Signed)
Patient presents for Depo Provera injection States feeling well. Last injection received 04/28/14 Next injection due 10/06/2014-10/20/2014

## 2014-10-10 ENCOUNTER — Ambulatory Visit: Payer: Medicaid Other | Attending: Internal Medicine | Admitting: *Deleted

## 2014-10-10 DIAGNOSIS — Z308 Encounter for other contraceptive management: Secondary | ICD-10-CM | POA: Diagnosis present

## 2014-10-10 MED ORDER — MEDROXYPROGESTERONE ACETATE 150 MG/ML IM SUSP
150.0000 mg | Freq: Once | INTRAMUSCULAR | Status: AC
  Administered 2014-10-10: 150 mg via INTRAMUSCULAR

## 2014-10-10 NOTE — Progress Notes (Signed)
Patient presents for Depo Provera injection States feeling well Last injection received 07/21/14 Next injection due 8/5 - 01/09/2015

## 2014-11-20 ENCOUNTER — Ambulatory Visit: Payer: Medicaid Other | Attending: Physician Assistant | Admitting: Physician Assistant

## 2014-11-20 ENCOUNTER — Other Ambulatory Visit (HOSPITAL_COMMUNITY)
Admission: RE | Admit: 2014-11-20 | Discharge: 2014-11-20 | Disposition: A | Discharge status: Home / Self Care | Payer: Medicaid Other | Source: Ambulatory Visit | Attending: Physician Assistant | Admitting: Physician Assistant

## 2014-11-20 VITALS — BP 128/88 | HR 106 | Temp 98.6°F | Resp 20 | Ht 67.0 in | Wt 162.6 lb

## 2014-11-20 DIAGNOSIS — N760 Acute vaginitis: Secondary | ICD-10-CM | POA: Diagnosis present

## 2014-11-20 DIAGNOSIS — Z113 Encounter for screening for infections with a predominantly sexual mode of transmission: Secondary | ICD-10-CM | POA: Diagnosis present

## 2014-11-20 DIAGNOSIS — N898 Other specified noninflammatory disorders of vagina: Secondary | ICD-10-CM | POA: Diagnosis not present

## 2014-11-20 LAB — POCT URINALYSIS DIPSTICK
Bilirubin, UA: NEGATIVE
GLUCOSE UA: NEGATIVE
LEUKOCYTES UA: NEGATIVE
NITRITE UA: NEGATIVE
PROTEIN UA: 30
Spec Grav, UA: 1.015
Urobilinogen, UA: 1
pH, UA: 7

## 2014-11-20 MED ORDER — TRAMADOL HCL 50 MG PO TABS: 50.0000 mg | ORAL_TABLET | Freq: Three times a day (TID) | ORAL | Status: DC | PRN

## 2014-11-20 MED ORDER — CYCLOBENZAPRINE HCL 10 MG PO TABS: 10.0000 mg | ORAL_TABLET | Freq: Three times a day (TID) | ORAL | Status: DC | PRN

## 2014-11-20 NOTE — Progress Notes (Signed)
Chief Complaint: Vaginal discharge and back pain  Subjective: This is a 34 year old female who is presenting with multiple complaints:  #1 back pain for 24 hours. Located to the mid back with no radiation down the leg. No numbness or tingling. No recent change in activity or movement of furniture any heavy lifting. No recent motor vehicle accident. No recent fall. She is unemployed.  #2 she has a vaginal discharge for a few days. White. Occasional lower abdominal pressure. No odor. Increased frequency with urination.   ROS:  GEN: denies fever or chills, denies change in weight Skin: denies lesions or rashes CV: denies CP or palpitations ABD: + lower abd pain, N or V GU: + white discharge   Objective:  Filed Vitals:   11/20/14 1055  BP: 128/88  Pulse: 106  Temp: 98.6 F (37 C)  TempSrc: Oral  Resp: 20  Height: 5\' 7"  (1.702 m)  Weight: 162 lb 9.6 oz (73.755 kg)  SpO2: 99%    Physical Exam:  General: in no acute distress. Heart: Normal  s1 &s2  Regular rhythm but tachy, without murmurs, rubs, gallops. Abdomen: Soft, nontender, nondistended, positive bowel sounds. Extremities: No clubbing cyanosis or edema with positive pedal pulses.; full ROM, diffusely tender   Pertinent Lab Results : dipstick negative   Medications: Prior to Admission medications   Medication Sig Start Date End Date Taking? Authorizing Provider  ibuprofen (ADVIL,MOTRIN) 600 MG tablet Take 1 tablet (600 mg total) by mouth every 8 (eight) hours as needed. 01/31/14  Yes Ambrose FinlandValerie A Keck, NP  cyclobenzaprine (FLEXERIL) 10 MG tablet Take 1 tablet (10 mg total) by mouth 3 (three) times daily as needed for muscle spasms. 11/20/14   Vivianne Masteriffany S Nariyah Osias, PA-C  HYDROcodone-acetaminophen (NORCO) 7.5-325 MG per tablet Take 1 tablet by mouth every 6 (six) hours as needed for moderate pain.    Historical Provider, MD  metroNIDAZOLE (FLAGYL) 500 MG tablet Take 1 tablet (500 mg total) by mouth 2 (two) times daily. Patient  not taking: Reported on 04/28/2014 02/14/14   Ambrose FinlandValerie A Keck, NP  penicillin v potassium (VEETID) 250 MG tablet Take 250 mg by mouth 4 (four) times daily.    Historical Provider, MD  traMADol (ULTRAM) 50 MG tablet Take 1 tablet (50 mg total) by mouth every 8 (eight) hours as needed. 11/20/14   Vivianne Masteriffany S Nesa Distel, PA-C    Assessment: 1. Vaginal Discharge 2. Mid back pain, sounds MSK   Plan: Dipstick-negative Pelvic exam culture results pending-did not treat for anything at this time  Flexeril Ultram Heating pads Went over stretching exercises 1 week f/u with Holland CommonsValerie Keck, NP   The patient was given clear instructions to go to ER or return to medical center if symptoms don't improve, worsen or new problems develop. The patient verbalized understanding. The patient was told to call to get lab results if they haven't heard anything in the next week.   This note has been created with Education officer, environmentalDragon speech recognition software and smart phrase technology. Any transcriptional errors are unintentional.   Scot Juniffany Cesareo Vickrey, PA-C 11/20/2014, 1:11 PM

## 2014-11-20 NOTE — Progress Notes (Signed)
Patient here actively crying with complaints of "terrible back pain" located in the middle of her back.  Patient states she has had back pain since she was younger, but starting last night it got "real bad" and hasn't been this way in a while.  Back pain is 10/10, constant, aching, throbbing, & stiff.  Patient states nothing relieves her pain.  Patient wants to be checked for Fibromyalgia, that she read about it and reports she has similar symptoms.  Patient reports taking AZO 4 days ago for discharge.  Patient took it yesterday, but not today.  She only takes Motrin as needed.

## 2014-11-21 ENCOUNTER — Telehealth: Payer: Self-pay | Admitting: Clinical

## 2014-11-21 LAB — CERVICOVAGINAL ANCILLARY ONLY
Chlamydia: NEGATIVE
NEISSERIA GONORRHEA: NEGATIVE
Trichomonas: NEGATIVE

## 2014-11-21 NOTE — Telephone Encounter (Signed)
Left HIPPA-compliant message for pt to call back at [phone]

## 2014-11-25 ENCOUNTER — Telehealth: Payer: Self-pay | Admitting: Clinical

## 2014-11-25 LAB — CERVICOVAGINAL ANCILLARY ONLY
BACTERIAL VAGINITIS: POSITIVE — AB
CANDIDA VAGINITIS: NEGATIVE

## 2014-11-25 NOTE — Telephone Encounter (Signed)
Check-in w pt; she says she is feeling "okay", does not have much of an appetite;  is taking her medication, but not sure if it is working or not. Pt encouraged to continue taking meds as prescribed, and to talk to PCP at next visit about any medication changes

## 2014-12-01 ENCOUNTER — Telehealth: Payer: Self-pay

## 2014-12-01 ENCOUNTER — Other Ambulatory Visit: Payer: Self-pay | Admitting: Internal Medicine

## 2014-12-01 DIAGNOSIS — B9689 Other specified bacterial agents as the cause of diseases classified elsewhere: Secondary | ICD-10-CM

## 2014-12-01 DIAGNOSIS — N760 Acute vaginitis: Principal | ICD-10-CM

## 2014-12-01 MED ORDER — METRONIDAZOLE 500 MG PO TABS: 500.0000 mg | ORAL_TABLET | Freq: Two times a day (BID) | ORAL | Status: DC

## 2014-12-01 NOTE — Telephone Encounter (Signed)
-----   Message from Quentin Angstlugbemiga E Jegede, MD sent at 12/01/2014 12:17 PM EDT ----- BV on wet prep, sent in flagyl

## 2014-12-01 NOTE — Telephone Encounter (Signed)
Nurse called patient, patient verified date of birth. Patient aware of BV on wet prep. Patient aware of BV being a difference in vaginal pH and is not a STD. Patient agrees to pick up Flagyl at pharmacy. Patient voices understanding and has no further questions at this time.

## 2014-12-05 ENCOUNTER — Encounter: Payer: Self-pay | Admitting: Clinical

## 2014-12-05 ENCOUNTER — Encounter: Payer: Self-pay | Admitting: Internal Medicine

## 2014-12-05 ENCOUNTER — Ambulatory Visit: Payer: Medicaid Other | Attending: Internal Medicine | Admitting: Internal Medicine

## 2014-12-05 VITALS — BP 119/79 | HR 93 | Temp 98.3°F | Resp 18 | Ht 67.0 in | Wt 163.0 lb

## 2014-12-05 DIAGNOSIS — M5441 Lumbago with sciatica, right side: Secondary | ICD-10-CM | POA: Diagnosis not present

## 2014-12-05 DIAGNOSIS — F419 Anxiety disorder, unspecified: Principal | ICD-10-CM

## 2014-12-05 DIAGNOSIS — F32A Depression, unspecified: Secondary | ICD-10-CM

## 2014-12-05 DIAGNOSIS — M5442 Lumbago with sciatica, left side: Secondary | ICD-10-CM | POA: Diagnosis not present

## 2014-12-05 DIAGNOSIS — F329 Major depressive disorder, single episode, unspecified: Secondary | ICD-10-CM

## 2014-12-05 LAB — POCT URINALYSIS DIPSTICK
Bilirubin, UA: NEGATIVE
Blood, UA: NEGATIVE
GLUCOSE UA: NEGATIVE
Ketones, UA: NEGATIVE
Nitrite, UA: NEGATIVE
PH UA: 6
Spec Grav, UA: 1.025
Urobilinogen, UA: 0.2

## 2014-12-05 MED ORDER — TRAMADOL HCL 50 MG PO TABS: 50.0000 mg | ORAL_TABLET | Freq: Two times a day (BID) | ORAL | Status: DC | PRN

## 2014-12-05 NOTE — Patient Instructions (Signed)

## 2014-12-05 NOTE — Progress Notes (Signed)
   Subjective:    Patient ID: Tammy Ray, female    DOB: Feb 23, 1981, 34 y.o.   MRN: [id number]  Back Pain This is a chronic problem. The current episode started more than 1 year ago. The problem occurs intermittently. The problem is unchanged. The quality of the pain is described as aching. The pain does not radiate. The pain is at a severity of 2/10. Pertinent negatives include no bladder incontinence, bowel incontinence or numbness. Risk factors include obesity and sedentary lifestyle. She has tried NSAIDs (Tramadol) for the symptoms. The treatment provided no relief.      Review of Systems  Gastrointestinal: Negative for bowel incontinence.  Genitourinary: Negative for bladder incontinence.  Musculoskeletal: Positive for back pain.  Neurological: Negative for numbness.       Objective:   Physical Exam  Cardiovascular: Normal rate, regular rhythm and normal heart sounds.   Pulmonary/Chest: Effort normal and breath sounds normal.  Abdominal: Soft. Bowel sounds are normal.  Musculoskeletal: She exhibits no tenderness.  Positive straight leg raises  Normal gait       Assessment & Plan:  Tammy Ray was seen today for follow-up.  Diagnoses and all orders for this visit:  Bilateral low back pain with sciatica, sciatica laterality unspecified Orders: -     Ambulatory referral to Orthopedic Surgery -     traMADol (ULTRAM) 50 MG tablet; Take 1 tablet (50 mg total) by mouth every 12 (twelve) hours as needed. -     POCT urinalysis dipstick  Return if symptoms worsen or fail to improve.   Ambrose FinlandValerie A Mckaylie Vasey, NP 12/05/2014 2:47 PM

## 2014-12-05 NOTE — Progress Notes (Signed)
Patient here for follow up for back pain. Patient reports pain today located in her lower back rated at a 2, described as aching. Patient reports she has had this pain for years. Pain is constant. Patient reports tramadol helps with the pain.   Patient does not need refills on any medications.

## 2014-12-05 NOTE — Progress Notes (Unsigned)
ASSESSMENT: Pt currently experiencing symptoms of anxiety and depression. [facility] Pt would benefit from psychoeducation and supportive counseling regarding coping with symptoms of anxiety and depression.  Stage of Change: contemplative  PLAN: 1. F/U with behavioral health consultant in as needed, consider making appointment upon leaving 2. Psychiatric Medications: none 3. Behavioral recommendation(s):   -Consider reading educational material regarding coping with symptoms of anxiety and depression -Spend time daily doing relaxation breathing -Keep crisis number available, as needed SUBJECTIVE: Pt. referred by Holland CommonsValerie Keck, NP for symptoms of anxiety and depression:  Pt. reports the following symptoms/concerns: Pt states that she has felt depressed for "a long time", since at least 34yo, and that past counseling and relaxation breathing has helped; medication has not helped in the past. She has been suicidal in the past, at 34yo and again about 7 years ago, but does not currently feel suicidal now.  Duration of problem: at least 20 years Severity: moderate  OBJECTIVE: Orientation & Cognition: Oriented x3. Thought processes normal and appropriate to situation. Mood: appropriate. Affect: appropriate Appearance: appropriate Risk of harm to self or others: no risk of harm to self or others Substance use: marijuana Assessments administered: PSQ9-16/ GAD7-15  Diagnosis: Anxiety and depression CPT Code: F41.8 -------------------------------------------- Other(s) present in the room: none  Time spent with patient in exam room: 20 minutes

## 2014-12-29 ENCOUNTER — Ambulatory Visit: Payer: Medicaid Other | Attending: Internal Medicine | Admitting: *Deleted

## 2014-12-29 DIAGNOSIS — Z3042 Encounter for surveillance of injectable contraceptive: Secondary | ICD-10-CM | POA: Insufficient documentation

## 2014-12-29 MED ORDER — MEDROXYPROGESTERONE ACETATE 150 MG/ML IM SUSP
150.0000 mg | Freq: Once | INTRAMUSCULAR | Status: AC
  Administered 2014-12-29: 150 mg via INTRAMUSCULAR

## 2014-12-29 NOTE — Progress Notes (Signed)
Patient presents for Depo Provera injection States feeling well except for chronic mid back pain Last injection received 10/10/14 Next injection due 10/24 - 03/30/2015

## 2015-01-15 ENCOUNTER — Encounter: Payer: Self-pay | Admitting: Physical Therapy

## 2015-01-15 ENCOUNTER — Ambulatory Visit: Payer: Medicaid Other | Attending: Orthopaedic Surgery | Admitting: Physical Therapy

## 2015-01-15 DIAGNOSIS — M629 Disorder of muscle, unspecified: Secondary | ICD-10-CM | POA: Insufficient documentation

## 2015-01-15 DIAGNOSIS — M545 Low back pain, unspecified: Secondary | ICD-10-CM

## 2015-01-15 NOTE — Therapy (Signed)
[facility]- [place] [address] [place], [place], [address] Phone: [phone]   Fax:  [phone]  Physical Therapy Evaluation  Patient Details  Name: Tammy Ray MRN: [id number] Date of Birth: 05-Jan-1981 Referring Provider:  Tarry Kos, MD  Encounter Date: 01/15/2015      PT End of Session - 01/15/15 0917    Visit Number 1   Number of Visits 1   Authorization Type Medicaid   PT Start Time 0840   PT Stop Time 0936   PT Time Calculation (min) 56 min   Activity Tolerance Patient limited by pain   Behavior During Therapy [facility] for tasks assessed/performed      Past Medical History  Diagnosis Date  . UTI (lower urinary tract infection)   . Kidney infection     History reviewed. No pertinent past surgical history.  There were no vitals filed for this visit.  Visit Diagnosis:  Midline low back pain without sciatica  Hamstring tightness of both lower extremities      Subjective Assessment - 01/15/15 0840    Subjective C/O low back pain  and into the thoracic and cervical area for many years.  No known cause   Limitations Sitting;Standing;Lifting;Walking   How long can you sit comfortably? 5 minutes   How long can you stand comfortably? 5 minutes   Diagnostic tests x-rays negative   Patient Stated Goals just have less pain   Currently in Pain? Yes   Pain Score 6    Pain Location Back   Pain Orientation Lower;Mid   Pain Descriptors / Indicators Aching;Burning;Tightness;Spasm   Pain Type Chronic pain   Pain Onset More than a month ago   Pain Frequency Constant   Aggravating Factors  sitting and standing up to 9/10   Pain Relieving Factors pain meds and rest            [facility] PT Assessment - 01/15/15 0001    Assessment   Medical Diagnosis Low back pain   Onset Date/Surgical Date 01/14/13   Prior Therapy n   Precautions   Precautions None   Balance Screen   Has the patient fallen in the past 6  months No   Has the patient had a decrease in activity level because of a fear of falling?  No   Is the patient reluctant to leave their home because of a fear of falling?  No   Home [employer]ist information centre manager residence   Additional Comments housework and yardwork   Prior Function   Level of Independence Independent   Vocation Unemployed   Leisure planks and sit ups   Posture/Postural Control   Posture Comments fwd head, rounded shoudlers   ROM / Strength   AROM / PROM / Strength --  Lumbar ROM WFL's with increased pain   Flexibility   Soft Tissue Assessment /Muscle Length --  tight HS and piriformis mms                   [facility] Adult PT Treatment/Exercise - 01/15/15 0001    Modalities   Modalities Electrical Stimulation;Moist Heat   Moist Heat Therapy   Number Minutes Moist Heat 15 Minutes   Moist Heat Location Lumbar Spine   Electrical Stimulation   Electrical Stimulation Location lumbar    Electrical Stimulation Action IFC   Electrical Stimulation Parameters tolerance   Electrical Stimulation Goals Pain  PT Education - 01/15/15 [phone]    Education provided Yes   Education Details Physioball core exercises, LE stretches   Person(s) Educated Patient   Methods Explanation;Demonstration;Handout   Comprehension Verbalized understanding;Returned demonstration          PT Short Term Goals - 01/15/15 0920    PT SHORT TERM GOAL #1   Title independent with initial HEP   Time 1   Period Days   Status Achieved                  Plan - 01/15/15 9604    Clinical Impression Statement Patient with long standing back pain, tight HS, significant mms spasms in the lumbar spine.  Gaurded motions.  Weak core.   Pt will benefit from skilled therapeutic intervention in order to improve on the following deficits Decreased strength;Decreased range of motion;Increased muscle spasms;Pain   Rehab Potential Good   PT Frequency One  time visit   PT Treatment/Interventions Electrical Stimulation;Moist Heat;Therapeutic exercise;Patient/family education   PT Next Visit Plan one time visit due to Medicaid, gave HEP   Consulted and Agree with Plan of Care Patient         Problem List Patient Active Problem List   Diagnosis Date Noted  . Notalgia 01/20/2014    Jearld Lesch., PT 01/15/2015, 9:21 AM  [facility]- [address] [place], [place], [address] Phone: [phone]   Fax:  [phone]

## 2015-02-10 ENCOUNTER — Telehealth: Payer: Self-pay

## 2015-02-10 ENCOUNTER — Telehealth: Payer: Self-pay | Admitting: Internal Medicine

## 2015-02-10 NOTE — Telephone Encounter (Signed)
Patient called stating that she is having discharge and would like something to treat. Please f/u with pt.

## 2015-02-10 NOTE — Telephone Encounter (Signed)
Returned patient phone call Patient states she is having a yellowish discharge and was requesting  A prescription for medication Patient was given an appointment for next week for her symptoms Would you want to prescribe flagyl or wait until patient comes in

## 2015-02-10 NOTE — Telephone Encounter (Signed)
Yellowish discharge is not normal. It needs to be evaluated. I will see her next week

## 2015-02-17 ENCOUNTER — Other Ambulatory Visit (HOSPITAL_COMMUNITY)
Admission: RE | Admit: 2015-02-17 | Discharge: 2015-02-17 | Disposition: A | Discharge status: Home / Self Care | Payer: Medicaid Other | Source: Ambulatory Visit | Attending: Internal Medicine | Admitting: Internal Medicine

## 2015-02-17 ENCOUNTER — Encounter: Payer: Self-pay | Admitting: Internal Medicine

## 2015-02-17 ENCOUNTER — Ambulatory Visit: Payer: Medicaid Other | Attending: Internal Medicine | Admitting: Internal Medicine

## 2015-02-17 VITALS — BP 124/80 | HR 80 | Temp 98.0°F | Resp 16 | Ht 67.0 in | Wt 160.0 lb

## 2015-02-17 DIAGNOSIS — Z23 Encounter for immunization: Secondary | ICD-10-CM | POA: Diagnosis not present

## 2015-02-17 DIAGNOSIS — N898 Other specified noninflammatory disorders of vagina: Secondary | ICD-10-CM | POA: Insufficient documentation

## 2015-02-17 DIAGNOSIS — Z01419 Encounter for gynecological examination (general) (routine) without abnormal findings: Secondary | ICD-10-CM | POA: Insufficient documentation

## 2015-02-17 DIAGNOSIS — N760 Acute vaginitis: Secondary | ICD-10-CM | POA: Insufficient documentation

## 2015-02-17 DIAGNOSIS — Z113 Encounter for screening for infections with a predominantly sexual mode of transmission: Secondary | ICD-10-CM | POA: Insufficient documentation

## 2015-02-17 DIAGNOSIS — Z1151 Encounter for screening for human papillomavirus (HPV): Secondary | ICD-10-CM | POA: Diagnosis present

## 2015-02-17 LAB — POCT URINALYSIS DIPSTICK
BILIRUBIN UA: NEGATIVE
GLUCOSE UA: NEGATIVE
Ketones, UA: NEGATIVE
Leukocytes, UA: NEGATIVE
Nitrite, UA: NEGATIVE
Protein, UA: NEGATIVE
SPEC GRAV UA: 1.02
Urobilinogen, UA: 1
pH, UA: 7

## 2015-02-17 LAB — RPR

## 2015-02-17 MED ORDER — FLUCONAZOLE 150 MG PO TABS: 150.0000 mg | ORAL_TABLET | Freq: Once | ORAL | Status: DC

## 2015-02-17 NOTE — Progress Notes (Signed)
Patient ID: Tammy Ray, female   DOB: [birth year], 34 y.o.   MRN: [id number]  CC: vaginal discharge   HPI: Tammy Ray is a 34 y.o. female here today for a follow up visit.  Patient has past medical history of chronic back pain. Patient reports that she has had a thick yellow/greenish vaginal discharge for two weeks. She reports that she has noticed some vaginal odor and itch. She denies new sexual partners or STD exposures. She reports being treated for bacterial vaginosis back in June. No dysuria or abdominal pain.   Patient has No headache, No chest pain, No abdominal pain - No Nausea, No new weakness tingling or numbness, No Cough - SOB.  Allergies  Allergen Reactions  . Oatmeal Rash    Rash on face   Past Medical History  Diagnosis Date  . UTI (lower urinary tract infection)   . Kidney infection    Current Outpatient Prescriptions on File Prior to Visit  Medication Sig Dispense Refill  . cyclobenzaprine (FLEXERIL) 10 MG tablet Take 1 tablet (10 mg total) by mouth 3 (three) times daily as needed for muscle spasms. 30 tablet 0  . traMADol (ULTRAM) 50 MG tablet Take 1 tablet (50 mg total) by mouth every 12 (twelve) hours as needed. 30 tablet 0  . ibuprofen (ADVIL,MOTRIN) 600 MG tablet Take 1 tablet (600 mg total) by mouth every 8 (eight) hours as needed. (Patient not taking: Reported on 12/05/2014) 30 tablet 1  . metroNIDAZOLE (FLAGYL) 500 MG tablet Take 1 tablet (500 mg total) by mouth 2 (two) times daily. 14 tablet 0   No current facility-administered medications on file prior to visit.   Family History  Problem Relation Age of Onset  . Cancer Maternal Aunt   . Cancer Maternal Grandmother    Social History   Social History  . Marital Status: Single    Spouse Name: N/A  . Number of Children: N/A  . Years of Education: N/A   Occupational History  . Not on file.   Social History Main Topics  . Smoking status: Former Smoker    Quit date: 11/19/2013  . Smokeless  tobacco: Not on file  . Alcohol Use: No  . Drug Use: No  . Sexual Activity: Not on file   Other Topics Concern  . Not on file   Social History Narrative    Review of Systems: Other than what is stated in HPI, all other systems are negative.   Objective:   Filed Vitals:   02/17/15 1436  BP: 124/80  Pulse: 80  Temp: 98 F (36.7 C)  Resp: 16    Physical Exam  Abdominal: Soft. Bowel sounds are normal. She exhibits no distension. There is no tenderness.  Genitourinary: Uterus normal. Cervix exhibits discharge and friability. Cervix exhibits no motion tenderness. Right adnexum displays no tenderness. Left adnexum displays no tenderness. No erythema in the vagina. Vaginal discharge found.  Lymphadenopathy:       Right: No inguinal adenopathy present.       Left: No inguinal adenopathy present.     Lab Results  Component Value Date   WBC 7.6 01/20/2014   HGB 12.4 01/20/2014   HCT 36.5 01/20/2014   MCV 91.5 01/20/2014   PLT 265 01/20/2014   Lab Results  Component Value Date   CREATININE 0.82 01/20/2014   BUN 18 01/20/2014   NA 137 01/20/2014   K 4.2 01/20/2014   CL 101 01/20/2014   CO2 23 01/20/2014  No results found for: HGBA1C Lipid Panel  No results found for: CHOL, TRIG, HDL, CHOLHDL, VLDL, LDLCALC     Assessment and plan:   Tammy Ray was seen today for vaginal discharge.  Diagnoses and all orders for this visit:  Vaginal discharge -     Urinalysis Dipstick -     HIV antibody (with reflex) -     RPR -     Cytology - PAP [place] -    Begin fluconazole (DIFLUCAN) 150 MG tablet; Take 1 tablet (150 mg total) by mouth once. Take one tablet today and another one in 1 week Will call with results  Need for influenza vaccination -     Flu Vaccine QUAD 36+ mos PF IM (Fluarix & Fluzone Quad PF)   Return if symptoms worsen or fail to improve.       Ambrose Finland, NP-C [facility] [phone] 02/17/2015, 3:17 PM

## 2015-02-17 NOTE — Progress Notes (Signed)
Patient complains of having a vaginal discharge for the past couple of weeks Patient states the discharge is yellowish/green in color

## 2015-02-18 LAB — CERVICOVAGINAL ANCILLARY ONLY
Chlamydia: NEGATIVE
NEISSERIA GONORRHEA: NEGATIVE
TRICH (WINDOWPATH): NEGATIVE

## 2015-02-18 LAB — CYTOLOGY - PAP

## 2015-02-18 LAB — HIV ANTIBODY (ROUTINE TESTING W REFLEX): HIV 1&2 Ab, 4th Generation: NONREACTIVE

## 2015-02-19 ENCOUNTER — Other Ambulatory Visit: Payer: Self-pay | Admitting: Internal Medicine

## 2015-02-19 ENCOUNTER — Telehealth: Payer: Self-pay

## 2015-02-19 DIAGNOSIS — N760 Acute vaginitis: Principal | ICD-10-CM

## 2015-02-19 DIAGNOSIS — B9689 Other specified bacterial agents as the cause of diseases classified elsewhere: Secondary | ICD-10-CM

## 2015-02-19 LAB — CERVICOVAGINAL ANCILLARY ONLY: WET PREP (BD AFFIRM): POSITIVE — AB

## 2015-02-19 MED ORDER — METRONIDAZOLE 0.75 % VA GEL: 1.0000 | Freq: Every day | VAGINAL | Status: DC

## 2015-02-19 NOTE — Telephone Encounter (Signed)
Nurse called patient, patient verified date of birth. Patient aware of everything normal except positive for yeast and bacterial vaginosis.  Patient is aware of already being treated for yeast. Patient agrees to pick up Metrogel at [facility] on [address] and take once per night for 7 days. Patient aware of bacterial vaginosis is not sexually transmitted disease but is an imbalance in the pH of the vagina.  Patient voices understanding and has no further questions at this time.

## 2015-02-19 NOTE — Telephone Encounter (Signed)
Nurse called patient, reached voicemail. Left message for patient to call [place] with [facility], at [phone].  

## 2015-02-19 NOTE — Telephone Encounter (Signed)
-----   Message from Valerie A Keck, NP sent at 02/19/2015  6:23 PM EDT ----- Everything normal except positive for yeast and bacterial vaginosis. I have already treated for yeast. I have sent over a prescription for Metrogel to take once per night for 7 days. No alcohol while on this medication 

## 2015-02-19 NOTE — Telephone Encounter (Signed)
-----   Message from Ambrose Finland, NP sent at 02/19/2015  6:23 PM EDT ----- Everything normal except positive for yeast and bacterial vaginosis. I have already treated for yeast. I have sent over a prescription for Metrogel to take once per night for 7 days. No alcohol while on this medication

## 2015-03-13 ENCOUNTER — Telehealth: Payer: Self-pay

## 2015-03-13 ENCOUNTER — Telehealth: Payer: Self-pay | Admitting: Internal Medicine

## 2015-03-13 MED ORDER — ACETAMINOPHEN-CODEINE #3 300-30 MG PO TABS: 1.0000 | ORAL_TABLET | Freq: Four times a day (QID) | ORAL | Status: DC | PRN

## 2015-03-13 NOTE — Telephone Encounter (Signed)
Spoke with patient and she is willing to try tylenol #3 i did explain to patient that if she was not satisfied with this we would refer Her out to pain management Provider approved medication

## 2015-03-13 NOTE — Telephone Encounter (Signed)
Patient called to request a prescription for her back pain. Patient stated that Tramadol did not work for her. Please f/u

## 2015-03-13 NOTE — Telephone Encounter (Signed)
Patient is requesting medication for her back pain  Patient stated the tramadol does not work What do you suggest?

## 2015-03-13 NOTE — Telephone Encounter (Signed)
We are unable to give strong pain medication here. She can try tylenol 3 to see if she has any better relief with that one

## 2015-03-27 ENCOUNTER — Ambulatory Visit: Payer: Medicaid Other | Attending: Internal Medicine

## 2015-03-27 VITALS — BP 110/72 | HR 80 | Temp 98.7°F | Resp 18 | Ht 66.0 in | Wt 160.0 lb

## 2015-03-27 DIAGNOSIS — Z3042 Encounter for surveillance of injectable contraceptive: Secondary | ICD-10-CM

## 2015-03-27 MED ORDER — MEDROXYPROGESTERONE ACETATE 150 MG/ML IM SUSP
150.0000 mg | Freq: Once | INTRAMUSCULAR | Status: AC
  Administered 2015-03-27: 150 mg via INTRAMUSCULAR

## 2015-03-27 NOTE — Progress Notes (Signed)
Patient here for depo injection. Patient reports feeling good and has no questions.  Last injection 12/29/14.  Next injection due 06/12/15-06/26/15.

## 2015-03-27 NOTE — Patient Instructions (Signed)
Please return between 06/12/15-06/26/15 for your next injection.

## 2015-07-23 ENCOUNTER — Encounter: Payer: Self-pay | Admitting: Internal Medicine

## 2015-07-23 ENCOUNTER — Ambulatory Visit: Payer: Medicaid Other | Attending: Internal Medicine | Admitting: Internal Medicine

## 2015-07-23 VITALS — BP 133/88 | HR 71 | Temp 98.0°F | Resp 16 | Ht 67.0 in | Wt 182.0 lb

## 2015-07-23 DIAGNOSIS — R5383 Other fatigue: Secondary | ICD-10-CM | POA: Diagnosis not present

## 2015-07-23 DIAGNOSIS — Z79899 Other long term (current) drug therapy: Secondary | ICD-10-CM | POA: Diagnosis not present

## 2015-07-23 DIAGNOSIS — L659 Nonscarring hair loss, unspecified: Secondary | ICD-10-CM | POA: Diagnosis not present

## 2015-07-23 DIAGNOSIS — Z809 Family history of malignant neoplasm, unspecified: Secondary | ICD-10-CM | POA: Diagnosis not present

## 2015-07-23 DIAGNOSIS — Z87891 Personal history of nicotine dependence: Secondary | ICD-10-CM | POA: Insufficient documentation

## 2015-07-23 DIAGNOSIS — Z888 Allergy status to other drugs, medicaments and biological substances status: Secondary | ICD-10-CM | POA: Diagnosis not present

## 2015-07-23 DIAGNOSIS — M545 Low back pain: Secondary | ICD-10-CM | POA: Diagnosis not present

## 2015-07-23 DIAGNOSIS — M5442 Lumbago with sciatica, left side: Secondary | ICD-10-CM | POA: Diagnosis not present

## 2015-07-23 LAB — BASIC METABOLIC PANEL
BUN: 12 mg/dL (ref 7–25)
CO2: 27 mmol/L (ref 20–31)
Calcium: 10.2 mg/dL (ref 8.6–10.2)
Chloride: 106 mmol/L (ref 98–110)
Creat: 0.87 mg/dL (ref 0.50–1.10)
GLUCOSE: 94 mg/dL (ref 65–99)
POTASSIUM: 3.9 mmol/L (ref 3.5–5.3)
Sodium: 142 mmol/L (ref 135–146)

## 2015-07-23 LAB — T4, FREE: Free T4: 1.1 ng/dL (ref 0.8–1.8)

## 2015-07-23 LAB — TSH: TSH: 1.32 mIU/L

## 2015-07-23 MED ORDER — MELOXICAM 15 MG PO TABS: 15.0000 mg | ORAL_TABLET | Freq: Every day | ORAL | Status: DC

## 2015-07-23 MED ORDER — METHOCARBAMOL 500 MG PO TABS: 500.0000 mg | ORAL_TABLET | Freq: Two times a day (BID) | ORAL | Status: DC | PRN

## 2015-07-23 NOTE — Progress Notes (Signed)
Patient ID: Tammy Ray, female   DOB: [birth year], 35 y.o.   MRN: [id number]   CC:  "I want to get my thyroid checked."  HPI: Tammy Ray is a 35 y.o. female here today for a thyroid check.  Patient has no past medical history.  Patient reports being fatigued, thinning hair and always being cold. Patient reports constant back pain since being a child and bilateral leg pain for the past 2 years.  Patient reports midline low back pain sometimes becomes numb and stiff.  Patient reports bilateral leg pain is a numbing/cramping sensation in her thighs and down the back of legs. Denies injury.  Endorses it gets better with rest.  Prescribed medications (Flexeril and Ibuprofen) has been minimally effective.  Patient has No headache, No chest pain, No abdominal pain - No Nausea, No Cough - SOB.  Allergies  Allergen Reactions  . Oatmeal Rash    Rash on face   Past Medical History  Diagnosis Date  . UTI (lower urinary tract infection)   . Kidney infection    Current Outpatient Prescriptions on File Prior to Visit  Medication Sig Dispense Refill  . cyclobenzaprine (FLEXERIL) 10 MG tablet Take 1 tablet (10 mg total) by mouth 3 (three) times daily as needed for muscle spasms. (Patient not taking: Reported on 07/23/2015) 30 tablet 0  . fluconazole (DIFLUCAN) 150 MG tablet Take 1 tablet (150 mg total) by mouth once. Take one tablet today and another one in 1 week (Patient not taking: Reported on 07/23/2015) 2 tablet 0  . metroNIDAZOLE (METROGEL VAGINAL) 0.75 % vaginal gel Place 1 Applicatorful vaginally at bedtime. For 7 days (Patient not taking: Reported on 07/23/2015) 70 g 0   No current facility-administered medications on file prior to visit.   Family History  Problem Relation Age of Onset  . Cancer Maternal Aunt   . Cancer Maternal Grandmother    Social History   Social History  . Marital Status: Single    Spouse Name: N/A  . Number of Children: N/A  . Years of Education: N/A    Occupational History  . Not on file.   Social History Main Topics  . Smoking status: Former Smoker    Quit date: 11/19/2013  . Smokeless tobacco: Not on file  . Alcohol Use: Yes     Comment: ocassional  . Drug Use: No  . Sexual Activity: Not on file   Other Topics Concern  . Not on file   Social History Narrative    Review of Systems: Constitutional: Appetite change and fatigue. Negative for fever, chills, diaphoresis, and activity change.  HENT: thinning hair Negative for ear pain, nosebleeds, congestion, facial swelling, rhinorrhea, neck pain, neck stiffness and ear discharge.  Eyes: Negative for pain, discharge, redness, itching and visual disturbance. Respiratory: Negative for cough, choking, chest tightness, shortness of breath, wheezing and stridor.  Cardiovascular: Negative for chest pain, palpitations and leg swelling. Musculoskeletal: Negative for back pain, joint swelling, arthralgias and gait problem. Neurological: Negative for dizziness, tremors, seizures, syncope, facial asymmetry, speech difficulty, weakness, light-headedness, numbness and headaches.   Objective:   Filed Vitals:   07/23/15 1526  BP: 133/88  Pulse: 71  Temp: 98 F (36.7 C)  Resp: 16    Physical Exam: Constitutional: Patient appears well-developed and well-nourished. No distress. HENT: Normocephalic, atraumatic Eyes: Conjunctivae and EOM are normal.  no scleral icterus. Neck: Normal ROM. Neck supple. No JVD. No tracheal deviation. No thyromegaly. CVS: RRR, S1/S2 +, no murmurs, no gallops,  no carotid bruit.  Pulmonary: Effort and breath sounds normal, no stridor, rhonchi, wheezes, rales.  Abdominal: Soft. BS +,  no distension, tenderness, rebound or guarding.  Musculoskeletal: Positive left straight leg test Normal range of motion. No edema and no tenderness.  Neuro: Alert.  Skin: Skin is warm and dry. No rash noted. Not diaphoretic. No erythema. No pallor.   Lab Results  Component  Value Date   WBC 7.6 01/20/2014   HGB 12.4 01/20/2014   HCT 36.5 01/20/2014   MCV 91.5 01/20/2014   PLT 265 01/20/2014   Lab Results  Component Value Date   CREATININE 0.82 01/20/2014   BUN 18 01/20/2014   NA 137 01/20/2014   K 4.2 01/20/2014   CL 101 01/20/2014   CO2 23 01/20/2014    No results found for: HGBA1C Lipid Panel  No results found for: CHOL, TRIG, HDL, CHOLHDL, VLDL, LDLCALC     Assessment and plan:   Cortlynn was seen today for thyroid check.  Diagnoses and all orders for this visit:  Thinning hair -     TSH -     T4, Free Follow up with patient once results come back.  Other fatigue -     Basic Metabolic Panel -     Vitamin D, 25-hydroxy       Follow up with patient once results come back.  Midline low back pain with left-sided sciatica -     methocarbamol (ROBAXIN) 500 MG tablet; Take 1 tablet (500 mg total) by mouth 2 (two) times daily as needed for muscle spasms. -     meloxicam (MOBIC) 15 MG tablet; Take 1 tablet (15 mg total) by mouth daily. Patient instructed to take medications as ordered.  If no improvement, ortho consult will be completed.   Patient to call if symptoms worsen or schedule an appointment.         [name], Student NP [facility] [phone] 07/23/2015, 4:04 PM

## 2015-07-23 NOTE — Progress Notes (Signed)
Patient here for check up Patient requesting to have her thyroid checked Has been having back pain and thinning hair Sister has thyroid problems as well

## 2015-07-24 LAB — VITAMIN D 25 HYDROXY (VIT D DEFICIENCY, FRACTURES): VIT D 25 HYDROXY: 21 ng/mL — AB (ref 30–100)

## 2015-07-27 ENCOUNTER — Telehealth: Payer: Self-pay

## 2015-07-27 NOTE — Telephone Encounter (Signed)
Called and spoke with patient in reference to her lab results Patient is aware she can take OTC vitamin D for bone health and fatigue and  Follow up at her next visit

## 2015-07-27 NOTE — Telephone Encounter (Signed)
-----   Message from Ambrose FinlandValerie A Keck, NP sent at 07/27/2015 12:38 PM EST ----- Low vitamin D. Please have pt to get OTC vitamin D 1,000 IU and take daily. This is essential for bone health and fatigue. All other labs are normal

## 2015-12-25 ENCOUNTER — Ambulatory Visit: Payer: Medicaid Other

## 2016-01-01 ENCOUNTER — Ambulatory Visit: Payer: Medicaid Other | Attending: Internal Medicine | Admitting: Physician Assistant

## 2016-01-01 VITALS — BP 105/74 | HR 79 | Temp 98.5°F | Resp 16 | Wt 175.2 lb

## 2016-01-01 DIAGNOSIS — F329 Major depressive disorder, single episode, unspecified: Secondary | ICD-10-CM

## 2016-01-01 DIAGNOSIS — L42 Pityriasis rosea: Secondary | ICD-10-CM | POA: Diagnosis not present

## 2016-01-01 DIAGNOSIS — F32A Depression, unspecified: Secondary | ICD-10-CM

## 2016-01-01 NOTE — Progress Notes (Signed)
Patient ID: Tammy Ray, female   DOB: 07/09/1980, 35 y.o.   MRN: [id number]   Tammy Ray, is a 35 y.o. female  [id number]  [id number]  DOB - July 16, 1980  Subjective:  Chief Complaint and HPI: Tammy Ray is a 35 y.o. female here today for 3 week history of pruritic rash on her back, arms, and chest.  No new soaps or detergents.  She did start a new multivitamin a few weeks ago.  The rash seemed to subside a little when she stopped taking the vitamin.  Hydrocortisone cream helps very little.   The rash started on one spot on her R upper back and then erupted in the other places several days later.  It waxes and wanes in intensity.  No f/c or other s/sx of illness.   We reviewed her GAD and PHQ together.  She says she does not have SI/HI.  She sometimes just feels discouraged and feels like giving up or "like I wish I wasn't here."  She denies any plan or intent to harm herself.  She has been on various medications in the past, but she doesn't remember the names and never found any of the medications to be very helpful.  She also went to counseling for a while which did seem to help.  She is in an 18 year relationship with her boyfriend which is stressful.  He smokes marijuana almost daily. She denies any problems with substances.   ROS:   Constitutional:  No f/c, No night sweats, No unexplained weight loss. EENT:  No vision changes, No blurry vision, No hearing changes. No mouth, throat, or ear problems.  Respiratory: No cough, No SOB Cardiac: No CP, no palpitations GI:  No abd pain, No N/V/D. GU: No Urinary s/sx Musculoskeletal: No joint pain Neuro: No headache, no dizziness, no motor weakness.  Skin: + rash Endocrine:  No polydipsia. No polyuria.  Psych: Denies SI/HI  No problems updated.  ALLERGIES: Allergies  Allergen Reactions  . Oatmeal Rash    Rash on face    PAST MEDICAL HISTORY: Past Medical History:  Diagnosis Date  . Kidney infection   . UTI (lower  urinary tract infection)     MEDICATIONS AT HOME: Prior to Admission medications   Not on File     Objective:  EXAM:   Vitals:   01/01/16 1211  BP: 105/74  Pulse: 79  Resp: 16  Temp: 98.5 F (36.9 C)  TempSrc: Oral  SpO2: 99%  Weight: 175 lb 3.2 oz (79.5 kg)    General appearance : A&OX3. NAD. Non-toxic-appearing HEENT: Atraumatic and Normocephalic.  PERRLA. EOM intact.  TM clear B. Mouth-MMM, post pharynx WNL w/o erythema, No PND. Neck: supple, no JVD. No cervical lymphadenopathy. No thyromegaly Chest/Lungs:  Breathing-non-labored, Good air entry bilaterally, breath sounds normal without rales, rhonchi, or wheezing  CVS: S1 S2 regular, no murmurs, gallops, rubs  Extremities: Bilateral Lower Ext shows no edema, both legs are warm to touch with = pulse throughout Neurology:  CN II-XII grossly intact, Non focal.   Psych:  TP linear. J/I WNL. Normal speech. Appropriate eye contact and affect.  Skin:  2X4cm herald patch on the R mid upper back-standard "christmas tree" eruption of small (<1cm)pale pink oval patches across back, chest, and both arms.  No sign of secondary infection.    Data Review No results found for: HGBA1C   Assessment & Plan   1. Depression Counseled.  Gave book "Codependent No More" - Ambulatory referral to Psychology  There are no acute safety issues.   2. Pityriasis rosea Anticipatory guidance and education.  Recommend Zyrtec 10mg  daily for the next few weeks to help with itching.   Patient have been counseled extensively about nutrition and exercise  Return in about 3 months (around 04/02/2016) for reassign with PCP and do CPE.;  Sooner if any needs arise.   The patient was given clear instructions to go to ER or return to medical center if symptoms don't improve, worsen or new problems develop. The patient verbalized understanding. The patient was told to call to get lab results if they haven't heard anything in the next week.     Tammy CoAngela  Tyjuan Demetro, PA-C [facility] [address] [place], [place] [phone]   01/01/2016, 1:33 PM

## 2016-01-01 NOTE — Progress Notes (Signed)
She does not have SI/HI.  No plan or intent.  Has found counseling helpful in the past-will refer.  No acute safety issues.

## 2016-01-01 NOTE — Patient Instructions (Addendum)
Pityriasis Rosea Pityriasis rosea is a rash that usually appears on the trunk of the body. It may also appear on the upper arms and upper legs. It usually begins as a single patch, and then more patches begin to develop. The rash may cause mild itching, but it normally does not cause other problems. It usually goes away without treatment. However, it may take weeks or months for the rash to go away completely. CAUSES The cause of this condition is not known. The condition does not spread from person to person (is noncontagious). RISK FACTORS This condition is more likely to develop in young adults and children. It is most common in the spring and fall. SYMPTOMS The main symptom of this condition is a rash.  The rash usually begins with a single oval patch that is larger than the ones that follow. This is called a herald patch. It generally appears a week or more before the rest of the rash appears.  When more patches start to develop, they spread quickly on the trunk, back, and arms. These patches are smaller than the first one.  The patches that make up the rash are usually oval-shaped and pink or red in color. They are usually flat, but they may sometimes be raised so that they can be felt with a finger. They may also be finely crinkled and have a scaly ring around the edge.  The rash does not typically appear on areas of the skin that are exposed to the sun. Most people who have this condition do not have other symptoms, but some have mild itching. In a few cases, a mild headache or body aches may occur before the rash appears and then go away. DIAGNOSIS Your health care provider may diagnose this condition by doing a physical exam and taking your medical history. To rule out other possible causes for the rash, the health care provider may order blood tests or take a skin sample from the rash to be looked at under a microscope. TREATMENT Usually, treatment is not needed for this condition. The  rash will probably go away on its own in 4-8 weeks. In some cases, a health care provider may recommend or prescribe medicine to reduce itching. HOME CARE INSTRUCTIONS  Take medicines only as directed by your health care provider.  Avoid scratching the affected areas of skin.  Do not take hot baths or use a sauna. Use only warm water when bathing or showering. Heat can increase itching. SEEK MEDICAL CARE IF:  Your rash does not go away in 8 weeks.  Your rash gets much worse.  You have a fever.  You have swelling or pain in the rash area.  You have fluid, blood, or pus coming from the rash area.   This information is not intended to replace advice given to you by your health care provider. Make sure you discuss any questions you have with your health care provider.   Document Released: 06/15/2001 Document Revised: 09/23/2014 Document Reviewed: 04/16/2014 Elsevier Interactive Patient Education 2016 [employer]    Zyrtec(cetirizine) 10mg  daily

## 2016-01-01 NOTE — Progress Notes (Signed)
Pt is in the office today for a rash Pt states the rash is located chest, back, arms and back Pt states she has been putting cream on it but not helping Pt states she has been taking some vitamins and maybe she allergic to something that is in the vitamins Pt states she has stopped taking the vitamins and the rash got better

## 2016-01-19 ENCOUNTER — Telehealth: Payer: Self-pay | Admitting: Internal Medicine

## 2016-01-19 ENCOUNTER — Encounter (HOSPITAL_COMMUNITY): Payer: Self-pay

## 2016-01-19 ENCOUNTER — Other Ambulatory Visit: Payer: Self-pay | Admitting: Internal Medicine

## 2016-01-19 MED ORDER — VITAMIN D (CHOLECALCIFEROL) 10 MCG (400 UNIT) PO CAPS: 400.0000 [IU] | ORAL_CAPSULE | Freq: Every day | ORAL | 11 refills | Status: DC

## 2016-01-19 NOTE — Telephone Encounter (Signed)
Pt called the office to speak with nurse regarding the over the counter vitamin she is taking and referral. Pt stated that the vitamin D that she curently taking is making her nauseas and give her a bad after taste. Wants to know what she can take instead of that. Also, patient needs the information of psychologist she was referred to. I don't see referral on Epic. Please follow up.   Thanks

## 2016-01-19 NOTE — Telephone Encounter (Signed)
Will forward to Dr. Jegede.  

## 2016-01-19 NOTE — Telephone Encounter (Signed)
Patient was referred to Psychologist, she needs to call them to schedule, there is a number listed on the referral for patient to call. I have prescribed Vit D for patient, pick up at the pharmacy.

## 2016-01-20 NOTE — Telephone Encounter (Signed)
Contacted pt to make aware about the rx for Vit D and the referral pt did not answer lvm for pt to give me a call at her earliest convenience

## 2016-01-20 NOTE — Telephone Encounter (Signed)
Pt returned call and is aware of the prescription of Vit D sent to the pharmacy. Pt doesn't need information regarding psychologist pt see's them tomorrow. Per pt she would like her [facility] for Vit D [facility] [address]. Pt states she moved and [facility] on [address]

## 2016-01-21 ENCOUNTER — Ambulatory Visit (HOSPITAL_COMMUNITY): Payer: Medicaid Other | Admitting: Licensed Clinical Social Worker

## 2016-01-21 MED ORDER — VITAMIN D (CHOLECALCIFEROL) 10 MCG (400 UNIT) PO CAPS
400.0000 [IU] | ORAL_CAPSULE | Freq: Every day | ORAL | 11 refills | Status: DC
Start: 2016-01-21 — End: 2016-05-10

## 2016-01-21 NOTE — Telephone Encounter (Signed)
[facility] [address] Just for future reference for the patient - [facility] can easily transfer between stores and she could just request that a transfer occur to prevent a delay and having to wait for us to send a new prescription.

## 2016-01-21 NOTE — Telephone Encounter (Signed)
Pt. Called [facility] Vitamin D, Cholecalciferol, 400 units CAPS and was told they did not have it. Pt. [facility] [address] Please f/u

## 2016-01-28 ENCOUNTER — Inpatient Hospital Stay (HOSPITAL_COMMUNITY)
Admission: AD | Admit: 2016-01-28 | Discharge: 2016-01-29 | Disposition: A | Discharge status: Home / Self Care | Payer: Medicaid Other | Source: Ambulatory Visit | Attending: Obstetrics & Gynecology | Admitting: Obstetrics & Gynecology

## 2016-01-28 ENCOUNTER — Emergency Department (HOSPITAL_COMMUNITY)
Admission: EM | Admit: 2016-01-28 | Discharge: 2016-01-28 | Disposition: A | Discharge status: Home / Self Care | Payer: Medicaid Other | Source: Home / Self Care

## 2016-01-28 ENCOUNTER — Inpatient Hospital Stay (HOSPITAL_COMMUNITY): Payer: Medicaid Other

## 2016-01-28 ENCOUNTER — Encounter (HOSPITAL_COMMUNITY): Payer: Self-pay

## 2016-01-28 ENCOUNTER — Encounter (HOSPITAL_COMMUNITY): Payer: Self-pay | Admitting: Emergency Medicine

## 2016-01-28 DIAGNOSIS — N760 Acute vaginitis: Secondary | ICD-10-CM | POA: Insufficient documentation

## 2016-01-28 DIAGNOSIS — B9689 Other specified bacterial agents as the cause of diseases classified elsewhere: Secondary | ICD-10-CM | POA: Diagnosis not present

## 2016-01-28 DIAGNOSIS — O209 Hemorrhage in early pregnancy, unspecified: Secondary | ICD-10-CM | POA: Diagnosis present

## 2016-01-28 DIAGNOSIS — R1030 Lower abdominal pain, unspecified: Secondary | ICD-10-CM | POA: Insufficient documentation

## 2016-01-28 DIAGNOSIS — O4691 Antepartum hemorrhage, unspecified, first trimester: Secondary | ICD-10-CM

## 2016-01-28 DIAGNOSIS — Z3491 Encounter for supervision of normal pregnancy, unspecified, first trimester: Secondary | ICD-10-CM

## 2016-01-28 DIAGNOSIS — O208 Other hemorrhage in early pregnancy: Secondary | ICD-10-CM | POA: Diagnosis not present

## 2016-01-28 DIAGNOSIS — Z3A01 Less than 8 weeks gestation of pregnancy: Secondary | ICD-10-CM | POA: Insufficient documentation

## 2016-01-28 DIAGNOSIS — O23591 Infection of other part of genital tract in pregnancy, first trimester: Secondary | ICD-10-CM

## 2016-01-28 DIAGNOSIS — Z87891 Personal history of nicotine dependence: Secondary | ICD-10-CM

## 2016-01-28 DIAGNOSIS — M549 Dorsalgia, unspecified: Secondary | ICD-10-CM | POA: Insufficient documentation

## 2016-01-28 DIAGNOSIS — O26899 Other specified pregnancy related conditions, unspecified trimester: Secondary | ICD-10-CM

## 2016-01-28 DIAGNOSIS — O98911 Unspecified maternal infectious and parasitic disease complicating pregnancy, first trimester: Secondary | ICD-10-CM | POA: Diagnosis not present

## 2016-01-28 DIAGNOSIS — O418X10 Other specified disorders of amniotic fluid and membranes, first trimester, not applicable or unspecified: Secondary | ICD-10-CM

## 2016-01-28 DIAGNOSIS — N938 Other specified abnormal uterine and vaginal bleeding: Secondary | ICD-10-CM

## 2016-01-28 DIAGNOSIS — O26891 Other specified pregnancy related conditions, first trimester: Secondary | ICD-10-CM | POA: Diagnosis present

## 2016-01-28 DIAGNOSIS — Z5321 Procedure and treatment not carried out due to patient leaving prior to being seen by health care provider: Secondary | ICD-10-CM

## 2016-01-28 DIAGNOSIS — O200 Threatened abortion: Secondary | ICD-10-CM | POA: Insufficient documentation

## 2016-01-28 DIAGNOSIS — R109 Unspecified abdominal pain: Secondary | ICD-10-CM

## 2016-01-28 DIAGNOSIS — O468X1 Other antepartum hemorrhage, first trimester: Secondary | ICD-10-CM

## 2016-01-28 LAB — URINALYSIS, ROUTINE W REFLEX MICROSCOPIC
Bilirubin Urine: NEGATIVE
Glucose, UA: NEGATIVE mg/dL
KETONES UR: NEGATIVE mg/dL
LEUKOCYTES UA: NEGATIVE
NITRITE: NEGATIVE
PH: 6.5 (ref 5.0–8.0)
Protein, ur: NEGATIVE mg/dL
SPECIFIC GRAVITY, URINE: 1.01 (ref 1.005–1.030)

## 2016-01-28 LAB — CBC
HEMATOCRIT: 35.9 % — AB (ref 36.0–46.0)
Hemoglobin: 12.3 g/dL (ref 12.0–15.0)
MCH: 29.9 pg (ref 26.0–34.0)
MCHC: 34.3 g/dL (ref 30.0–36.0)
MCV: 87.3 fL (ref 78.0–100.0)
Platelets: 316 10*3/uL (ref 150–400)
RBC: 4.11 MIL/uL (ref 3.87–5.11)
RDW: 12.4 % (ref 11.5–15.5)
WBC: 8.7 10*3/uL (ref 4.0–10.5)

## 2016-01-28 LAB — WET PREP, GENITAL
Sperm: NONE SEEN
Trich, Wet Prep: NONE SEEN
Yeast Wet Prep HPF POC: NONE SEEN

## 2016-01-28 LAB — URINE MICROSCOPIC-ADD ON

## 2016-01-28 LAB — HCG, QUANTITATIVE, PREGNANCY: hCG, Beta Chain, Quant, S: 105662 m[IU]/mL — ABNORMAL HIGH (ref <5)

## 2016-01-28 MED ORDER — PROMETHAZINE HCL 25 MG PO TABS
25.0000 mg | ORAL_TABLET | Freq: Once | ORAL | Status: AC
Start: 2016-01-28 — End: 2016-01-28
  Administered 2016-01-28: 25 mg via ORAL
  Filled 2016-01-28: qty 1

## 2016-01-28 MED ORDER — ACETAMINOPHEN 325 MG PO TABS
650.0000 mg | ORAL_TABLET | Freq: Once | ORAL | Status: AC
  Administered 2016-01-28: 650 mg via ORAL
  Filled 2016-01-28: qty 2

## 2016-01-28 MED ORDER — PROMETHAZINE HCL 12.5 MG PO TABS: 12.5000 mg | ORAL_TABLET | Freq: Four times a day (QID) | ORAL | 0 refills | Status: DC | PRN

## 2016-01-28 MED ORDER — METRONIDAZOLE 500 MG PO TABS: 500.0000 mg | ORAL_TABLET | Freq: Two times a day (BID) | ORAL | 0 refills | Status: DC

## 2016-01-28 NOTE — MAU Provider Note (Signed)
History     CSN: [id number]  Arrival date and time: 01/28/16 1903   None     Chief Complaint  Patient presents with  . Back Pain  . Vaginal Bleeding  . Abdominal Pain   HPI Tammy Ray is a 35 y.o. [id number] at 6624w5d by LMP who presents with abdominal pain, back pain, & vaginal bleeding. All of her symptoms began 5 days ago. Reports brown spotting when she wipes; no bright red bleeding or clots. Lower abdominal cramping that is constant & radiates to low back. Rates pain 10/10. Movement makes pain worse. Has not treated pain. Daily nausea & vomiting. Has vomited twice today. Denies fever/chills, recent intercourse, diarrhea, constipation, dysuria.   OB History    Gravida Para Term Preterm AB Living   6 3 2 1 2 3    SAB TAB Ectopic Multiple Live Births     2     3      Past Medical History:  Diagnosis Date  . Kidney infection   . UTI (lower urinary tract infection)     History reviewed. No pertinent surgical history.  Family History  Problem Relation Age of Onset  . Cancer Maternal Aunt   . Cancer Maternal Grandmother     Social History  Substance Use Topics  . Smoking status: Former Smoker    Quit date: 11/19/2013  . Smokeless tobacco: Not on file  . Alcohol use Yes     Comment: ocassional    Allergies:  Allergies  Allergen Reactions  . Oatmeal Rash    Prescriptions Prior to Admission  Medication Sig Dispense Refill Last Dose  . Vitamin D, Cholecalciferol, 400 units CAPS Take 400 Units by mouth daily. (Patient not taking: Reported on 01/28/2016) 30 capsule 11     Review of Systems  Constitutional: Negative.   Gastrointestinal: Positive for abdominal pain, nausea and vomiting. Negative for constipation and diarrhea.  Genitourinary: Negative for dysuria.       + vaginal bleeding (brown discharge)  Musculoskeletal: Positive for back pain.   Physical Exam   Blood pressure 111/81, pulse 86, temperature 98.5 F (36.9 C), temperature source Oral, resp. rate  20, height 5\' 6"  (1.676 m), weight 171 lb (77.6 kg), last menstrual period 01/09/2016, SpO2 98 %.  Physical Exam  Nursing note and vitals reviewed. Constitutional: She is oriented to person, place, and time. She appears well-developed and well-nourished. No distress.  HENT:  Head: Normocephalic and atraumatic.  Eyes: Conjunctivae are normal. Right eye exhibits no discharge. Left eye exhibits no discharge. No scleral icterus.  Neck: Normal range of motion.  Cardiovascular: Normal rate, regular rhythm and normal heart sounds.   No murmur heard. Respiratory: Effort normal and breath sounds normal. No respiratory distress. She has no wheezes.  GI: Soft. Bowel sounds are normal. She exhibits no distension. There is tenderness. There is no rebound and no guarding.  Genitourinary: Uterus normal. Cervix exhibits motion tenderness. Cervix exhibits no friability. Right adnexum displays no mass and no tenderness. Left adnexum displays no mass and no tenderness. No bleeding in the vagina. Vaginal discharge (moderate amount of thick tan disharge) found.  Genitourinary Comments: Cervix closed  Neurological: She is alert and oriented to person, place, and time.  Skin: Skin is warm and dry. She is not diaphoretic.  Psychiatric: She has a normal mood and affect. Her behavior is normal. Judgment and thought content normal.    MAU Course  Procedures Results for orders placed or performed during the hospital encounter  of 01/28/16 (from the past 24 hour(s))  Urinalysis, Routine w reflex microscopic (not at [facility])     Status: Abnormal   Collection Time: 01/28/16  7:30 PM  Result Value Ref Range   Color, Urine YELLOW YELLOW   APPearance CLEAR CLEAR   Specific Gravity, Urine 1.010 1.005 - 1.030   pH 6.5 5.0 - 8.0   Glucose, UA NEGATIVE NEGATIVE mg/dL   Hgb urine dipstick TRACE (A) NEGATIVE   Bilirubin Urine NEGATIVE NEGATIVE   Ketones, ur NEGATIVE NEGATIVE mg/dL   Protein, ur NEGATIVE NEGATIVE mg/dL    Nitrite NEGATIVE NEGATIVE   Leukocytes, UA NEGATIVE NEGATIVE  Urine microscopic-add on     Status: Abnormal   Collection Time: 01/28/16  7:30 PM  Result Value Ref Range   Squamous Epithelial / LPF 6-30 (A) NONE SEEN   WBC, UA 0-5 0 - 5 WBC/hpf   RBC / HPF 0-5 0 - 5 RBC/hpf   Bacteria, UA RARE (A) NONE SEEN   Urine-Other MUCOUS PRESENT   CBC     Status: Abnormal   Collection Time: 01/28/16  7:41 PM  Result Value Ref Range   WBC 8.7 4.0 - 10.5 K/uL   RBC 4.11 3.87 - 5.11 MIL/uL   Hemoglobin 12.3 12.0 - 15.0 g/dL   HCT 96.0 (L) 45.4 - 09.8 %   MCV 87.3 78.0 - 100.0 fL   MCH 29.9 26.0 - 34.0 pg   MCHC 34.3 30.0 - 36.0 g/dL   RDW 11.9 14.7 - 82.9 %   Platelets 316 150 - 400 K/uL  hCG, quantitative, pregnancy     Status: Abnormal   Collection Time: 01/28/16  7:41 PM  Result Value Ref Range   hCG, Beta Chain, Quant, S 105,662 (H) <5 mIU/mL  Wet prep, genital     Status: Abnormal   Collection Time: 01/28/16  9:25 PM  Result Value Ref Range   Yeast Wet Prep HPF POC NONE SEEN NONE SEEN   Trich, Wet Prep NONE SEEN NONE SEEN   Clue Cells Wet Prep HPF POC PRESENT (A) NONE SEEN   WBC, Wet Prep HPF POC FEW (A) NONE SEEN   Sperm NONE SEEN    US Ob Comp Less 14 Wks  Result Date: 01/28/2016 CLINICAL DATA:  35 year old female with low back pain and spotting. EXAM: OBSTETRIC <14 WK ULTRASOUND TECHNIQUE: Transabdominal ultrasound was performed for evaluation of the gestation as well as the maternal uterus and adnexal regions. COMPARISON:  None. FINDINGS: Intrauterine gestational sac: Single intrauterine gestational sac. Yolk sac:  Present Embryo:  Present Cardiac Activity: Detected Heart Rate: 148 bpm CRL:   12  mm   7 w 3 d                  [place] EDC: 09/12/2016 Subchorionic hemorrhage:  Small subchorionic hemorrhage Maternal uterus/adnexae: The right ovary appears unremarkable. The left ovary is not visualized. There is trace free fluid within the pelvis. IMPRESSION: Single live intrauterine pregnancy  with an estimated gestational age of [redacted] weeks, 3 days. Trace subchorionic hemorrhage. Follow-up recommended. Electronically Signed   By: Elgie Collard M.D.   On: 01/28/2016 22:36   US Ob Transvaginal  Result Date: 01/28/2016 CLINICAL DATA:  35 year old female with low back pain and spotting. EXAM: OBSTETRIC <14 WK ULTRASOUND TECHNIQUE: Transabdominal ultrasound was performed for evaluation of the gestation as well as the maternal uterus and adnexal regions. COMPARISON:  None. FINDINGS: Intrauterine gestational sac: Single intrauterine gestational sac. Yolk sac:  Present  Embryo:  Present Cardiac Activity: Detected Heart Rate: 148 bpm CRL:   12  mm   7 w 3 d                  [place] EDC: 09/12/2016 Subchorionic hemorrhage:  Small subchorionic hemorrhage Maternal uterus/adnexae: The right ovary appears unremarkable. The left ovary is not visualized. There is trace free fluid within the pelvis. IMPRESSION: Single live intrauterine pregnancy with an estimated gestational age of [redacted] weeks, 3 days. Trace subchorionic hemorrhage. Follow-up recommended. Electronically Signed   By: Elgie Collard M.D.   On: 01/28/2016 22:36     MDM +UPT UA, wet prep, GC/chlamydia, CBC, ABO/Rh, quant hCG,  and [place] today to rule out ectopic pregnancy B positive Ultrasound shows SIUP with cardiac acitvity; small SCH Tylenol 650 mg PO & phenergan 25 mg PO Assessment and Plan  A: 1. Normal IUP (intrauterine pregnancy) on prenatal ultrasound, first trimester   2. Vaginal bleeding in pregnancy, first trimester   3. Abdominal pain affecting pregnancy   4. Subchorionic hematoma in first trimester   5. BV (bacterial vaginosis)     P: Discharge home Rx flagyl & phenergan Pelvic rest Start prenatal care Discussed reasons to return to MAU  Judeth Horn 01/28/2016, 9:36 PM

## 2016-01-28 NOTE — ED Triage Notes (Signed)
Pt reports irregular period in August that was lighter than usual. Pt took a pregnancy test and it was positive. Pt c.o. Breast tenderness, nausea, spotting that started several days ago with pink clumpy discharge, now reports discharge is brown. Pt concerned for miscarriage. Pt c.o. Lower back cramping and lower abdominal cramping.

## 2016-01-28 NOTE — Discharge Instructions (Signed)
Pelvic Rest °Pelvic rest is sometimes recommended for women when:  °· The placenta is partially or completely covering the opening of the cervix (placenta previa). °· There is bleeding between the uterine wall and the amniotic sac in the first trimester (subchorionic hemorrhage). °· The cervix begins to open without labor starting (incompetent cervix, cervical insufficiency). °· The labor is too early (preterm labor). °HOME CARE INSTRUCTIONS °· Do not have sexual intercourse, stimulation, or an orgasm. °· Do not use tampons, douche, or put anything in the vagina. °· Do not lift anything over 10 pounds (4.5 kg). °· Avoid strenuous activity or straining your pelvic muscles. °SEEK MEDICAL CARE IF:  °· You have any vaginal bleeding during pregnancy. Treat this as a potential emergency. °· You have cramping pain felt low in the stomach (stronger than menstrual cramps). °· You notice vaginal discharge (watery, mucus, or bloody). °· You have a low, dull backache. °· There are regular contractions or uterine tightening. °SEEK IMMEDIATE MEDICAL CARE IF: °You have vaginal bleeding and have placenta previa.  °  °This information is not intended to replace advice given to you by your health care provider. Make sure you discuss any questions you have with your health care provider. °  °Document Released: 09/03/2010 Document Revised: 08/01/2011 Document Reviewed: 11/10/2014 °Elsevier Interactive Patient Education ©2016 Elsevier Inc. °Subchorionic Hematoma °A subchorionic hematoma is a gathering of blood between the outer wall of the placenta and the inner wall of the womb (uterus). The placenta is the organ that connects the fetus to the wall of the uterus. The placenta performs the feeding, breathing (oxygen to the fetus), and waste removal (excretory work) of the fetus.  °Subchorionic hematoma is the most common abnormality found on a result from ultrasonography done during the first trimester or early second trimester of  pregnancy. If there has been little or no vaginal bleeding, early small hematomas usually shrink on their own and do not affect your baby or pregnancy. The blood is gradually absorbed over 1-2 weeks. When bleeding starts later in pregnancy or the hematoma is larger or occurs in an older pregnant woman, the outcome may not be as good. Larger hematomas may get bigger, which increases the chances for miscarriage. Subchorionic hematoma also increases the risk of premature detachment of the placenta from the uterus, preterm (premature) labor, and stillbirth. °HOME CARE INSTRUCTIONS °· Stay on bed rest if your health care provider recommends this. Although bed rest will not prevent more bleeding or prevent a miscarriage, your health care provider may recommend bed rest until you are advised otherwise. °· Avoid heavy lifting (more than 10 lb [4.5 kg]), exercise, sexual intercourse, or douching as directed by your health care provider. °· Keep track of the number of pads you use each day and how soaked (saturated) they are. Write down this information. °· Do not use tampons. °· Keep all follow-up appointments as directed by your health care provider. Your health care provider may ask you to have follow-up blood tests or ultrasound tests or both. °SEEK IMMEDIATE MEDICAL CARE IF: °· You have severe cramps in your stomach, back, abdomen, or pelvis. °· You have a fever. °· You pass large clots or tissue. Save any tissue for your health care provider to look at. °· Your bleeding increases or you become lightheaded, feel weak, or have fainting episodes. °  °This information is not intended to replace advice given to you by your health care provider. Make sure you discuss any questions you have with   your health care provider. °  °Document Released: 08/24/2006 Document Revised: 05/30/2014 Document Reviewed: 12/06/2012 °Elsevier Interactive Patient Education ©2016 Elsevier Inc. ° °

## 2016-01-28 NOTE — ED Notes (Signed)
Pt refuses to stay, sts she does not want to wait and that [facility] is [facility].

## 2016-01-28 NOTE — MAU Note (Signed)
Pt states that she had a +upt about 3 days. States she has lower back and abdominal pain and some spotting that started about 4 days ago. Denies fever but has some chills. Denies urinary s/s. LMP: States that she had a "very light period" on 8/19.

## 2016-01-28 NOTE — ED Notes (Signed)
Pt does not want blood work drawn and [facility] to wait times at ED.

## 2016-01-29 LAB — GC/CHLAMYDIA PROBE AMP (~~LOC~~) NOT AT ARMC
CHLAMYDIA, DNA PROBE: NEGATIVE
NEISSERIA GONORRHEA: NEGATIVE

## 2016-01-29 LAB — HIV ANTIBODY (ROUTINE TESTING W REFLEX): HIV Screen 4th Generation wRfx: NONREACTIVE

## 2016-02-04 ENCOUNTER — Encounter: Payer: Self-pay | Admitting: Internal Medicine

## 2016-02-04 ENCOUNTER — Ambulatory Visit: Payer: Medicaid Other | Attending: Internal Medicine | Admitting: Internal Medicine

## 2016-02-04 VITALS — BP 112/78 | HR 114 | Temp 98.0°F | Resp 15 | Wt 168.6 lb

## 2016-02-04 DIAGNOSIS — M549 Dorsalgia, unspecified: Secondary | ICD-10-CM | POA: Insufficient documentation

## 2016-02-04 DIAGNOSIS — Z79899 Other long term (current) drug therapy: Secondary | ICD-10-CM | POA: Insufficient documentation

## 2016-02-04 DIAGNOSIS — Z3490 Encounter for supervision of normal pregnancy, unspecified, unspecified trimester: Secondary | ICD-10-CM

## 2016-02-04 DIAGNOSIS — O26891 Other specified pregnancy related conditions, first trimester: Secondary | ICD-10-CM | POA: Diagnosis not present

## 2016-02-04 DIAGNOSIS — Z87891 Personal history of nicotine dependence: Secondary | ICD-10-CM | POA: Diagnosis not present

## 2016-02-04 DIAGNOSIS — Z3A08 8 weeks gestation of pregnancy: Secondary | ICD-10-CM | POA: Diagnosis not present

## 2016-02-04 DIAGNOSIS — Z7189 Other specified counseling: Secondary | ICD-10-CM | POA: Insufficient documentation

## 2016-02-04 DIAGNOSIS — F329 Major depressive disorder, single episode, unspecified: Secondary | ICD-10-CM | POA: Diagnosis not present

## 2016-02-04 DIAGNOSIS — Z331 Pregnant state, incidental: Secondary | ICD-10-CM | POA: Diagnosis not present

## 2016-02-04 DIAGNOSIS — R112 Nausea with vomiting, unspecified: Secondary | ICD-10-CM | POA: Diagnosis not present

## 2016-02-04 DIAGNOSIS — O219 Vomiting of pregnancy, unspecified: Secondary | ICD-10-CM | POA: Diagnosis not present

## 2016-02-04 DIAGNOSIS — F32A Depression, unspecified: Secondary | ICD-10-CM

## 2016-02-04 DIAGNOSIS — O99351 Diseases of the nervous system complicating pregnancy, first trimester: Secondary | ICD-10-CM | POA: Insufficient documentation

## 2016-02-04 DIAGNOSIS — Z131 Encounter for screening for diabetes mellitus: Secondary | ICD-10-CM | POA: Insufficient documentation

## 2016-02-04 DIAGNOSIS — O99341 Other mental disorders complicating pregnancy, first trimester: Secondary | ICD-10-CM | POA: Diagnosis not present

## 2016-02-04 DIAGNOSIS — G8929 Other chronic pain: Secondary | ICD-10-CM | POA: Diagnosis not present

## 2016-02-04 LAB — BASIC METABOLIC PANEL WITH GFR
BUN: 8 mg/dL (ref 7–25)
CALCIUM: 9.5 mg/dL (ref 8.6–10.2)
CO2: 24 mmol/L (ref 20–31)
Chloride: 103 mmol/L (ref 98–110)
Creat: 0.77 mg/dL (ref 0.50–1.10)
GFR, Est African American: >89 mL/min (ref >=60)
GLUCOSE: 89 mg/dL (ref 65–99)
Potassium: 3.7 mmol/L (ref 3.5–5.3)
Sodium: 135 mmol/L (ref 135–146)

## 2016-02-04 LAB — POCT GLYCOSYLATED HEMOGLOBIN (HGB A1C): Hemoglobin A1C: 5.1

## 2016-02-04 MED ORDER — PRENATAL VITAMIN 27-0.8 MG PO TABS: 1.0000 | ORAL_TABLET | Freq: Every day | ORAL | 2 refills | Status: DC

## 2016-02-04 NOTE — Progress Notes (Signed)
Tammy Ray, is a 35 y.o. female  RUE:[id number]:[id number]  JYN:[id number]  DOB - 1980-11-06  CC:  Chief Complaint  Patient presents with  . Establish Care  . Referral       HPI: Tammy Ray is a 35 y.o. female here today to establish medical care., G6 P2123, no significant pmhx except chronic back pain,  last seen in clinic 01/01/16 in clinic (for rash, resolved per pt) now back for ER f/u.  Pt is pregnant about 8wks now, and needs referral to ob.  She states she has been spotting slightly, [facility], they did vag US and no emergent findings noted except trace subchorionic hemorrhage noted.    Pt c/o of abd pains/unconfortable feeling, n/v, unable to keep anything down.  Sprite helps some.  Smokes occasionally, denies etoh.  Pt has thoughts of "dieing" but no plans.  She understands to get help /call 911 if has SI.  Denies AVH/HI.   Patient has No headache, No chest pain, No new weakness tingling or numbness, No Cough - SOB.    Review of Systems: Per HPI, o/w all systems reviewed and negative.   Allergies  Allergen Reactions  . Oatmeal Rash   Past Medical History:  Diagnosis Date  . Kidney infection   . UTI (lower urinary tract infection)    Current Outpatient Prescriptions on File Prior to Visit  Medication Sig Dispense Refill  . promethazine (PHENERGAN) 12.5 MG tablet Take 1 tablet (12.5 mg total) by mouth every 6 (six) hours as needed for nausea or vomiting. 30 tablet 0  . metroNIDAZOLE (FLAGYL) 500 MG tablet Take 1 tablet (500 mg total) by mouth 2 (two) times daily. (Patient not taking: Reported on 02/04/2016) 14 tablet 0  . Vitamin D, Cholecalciferol, 400 units CAPS Take 400 Units by mouth daily. (Patient not taking: Reported on 02/04/2016) 30 capsule 11   No current facility-administered medications on file prior to visit.    Family History  Problem Relation Age of Onset  . Cancer Maternal Aunt   . Cancer Maternal Grandmother    Social  History   Social History  . Marital status: Single    Spouse name: N/A  . Number of children: N/A  . Years of education: N/A   Occupational History  . Not on file.   Social History Main Topics  . Smoking status: Former Smoker    Quit date: 11/19/2013  . Smokeless tobacco: Not on file  . Alcohol use Yes     Comment: ocassional  . Drug use: No  . Sexual activity: Not on file   Other Topics Concern  . Not on file   Social History Narrative  . No narrative on file    Objective:   Vitals:   02/04/16 0941  BP: 112/78  Pulse: (!) 114  Resp: 15  Temp: 98 F (36.7 C)    Filed Weights   02/04/16 0941  Weight: 168 lb 9.6 oz (76.5 kg)    BP Readings from Last 3 Encounters:  02/04/16 112/78  01/29/16 124/72  01/28/16 125/86    Physical Exam: Constitutional: Patient appears well-developed and well-nourished. No distress. AAOx3, tired appearing, good eye contact,  HENT: Normocephalic, atraumatic, External right and left ear normal. Oropharynx is clear and moist.  Eyes: Conjunctivae and EOM are normal. PERRL, no scleral icterus. Neck: Normal ROM. Neck supple. No JVD.  CVS: RRR, S1/S2 +, no murmurs, no gallops, no carotid bruit.  Pulmonary: Effort and breath sounds normal,  no stridor, rhonchi, wheezes, rales.  Abdominal: Soft. BS +,  Obese, no distension, tenderness, rebound or guarding.  Musculoskeletal: Normal range of motion. No edema and no tenderness.  LE: bilat/ no c/c/e, pulses 2+ bilateral. Neuro: Alert.  muscle tone coordination wnl. No cranial nerve deficit grossly. Skin: Skin is warm and dry. No rash noted. Not diaphoretic. No erythema. No pallor. Psychiatric: Normal mood and affect. Behavior, judgment, thought content normal.  Lab Results  Component Value Date   WBC 8.7 01/28/2016   HGB 12.3 01/28/2016   HCT 35.9 (L) 01/28/2016   MCV 87.3 01/28/2016   PLT 316 01/28/2016   Lab Results  Component Value Date   CREATININE 0.87 07/23/2015   BUN 12  07/23/2015   NA 142 07/23/2015   K 3.9 07/23/2015   CL 106 07/23/2015   CO2 27 07/23/2015    No results found for: HGBA1C Lipid Panel  No results found for: CHOL, TRIG, HDL, CHOLHDL, VLDL, LDLCALC     Depression screen [facility] 2/9 02/04/2016 01/01/2016 12/05/2014 12/05/2014 11/20/2014  Decreased Interest 1 3 2 2 2   Down, Depressed, Hopeless 1 3 2 2 3   PHQ - 2 Score 2 6 4 4 5   Altered sleeping 2 3 2 2 3   Tired, decreased energy 3 2 2 2 3   Change in appetite 3 2 1 1 2   Feeling bad or failure about yourself  2 2 3 3 3   Trouble concentrating 1 3 2 2 3   Moving slowly or fidgety/restless 1 2 1 1 2   Suicidal thoughts 1 2 1 1 2   PHQ-9 Score 15 22 16 16 23     Assessment and plan:   1. Pregnant About [redacted] wks along based on last [place]. - Ambulatory referral to Obstetrics / Gynecology - BASIC METABOLIC PANEL WITH GFR - HgB Z6X -prenatal vitamin started - pt still has prn phenergan  2. Diabetes mellitus screening - HgB A1c 5.1  3. Nausea and vomiting, intractability of vomiting not specified, unspecified vomiting type Related to pregnancy. - prn phenergan  4. Depression, emotion Not certain if related to pregnancy, circumstances, adjustment d/o - defer to obgyn re: rx - dw pt at length about going to ER or calling crisis hotline if she has thoughts and plans of hurting herself for others - currently no active plans, no hi/avh.  Return in about 3 months (around 05/05/2016).  The patient was given clear instructions to go to ER or return to medical center if symptoms don't improve, worsen or new problems develop. The patient verbalized understanding. The patient was told to call to get lab results if they haven't heard anything in the next week.    This note has been created with Education officer, environmental. Any transcriptional errors are unintentional.   Pete Glatter, MD, MBA/MHA [facility] [place],  [place] [phone]   02/04/2016, 10:47 AM

## 2016-02-04 NOTE — Progress Notes (Signed)
Pt is in the office today for establish care and referral Pt states her pain level today in the office is a 8 Pt states her pain is coming from her stomach and back

## 2016-02-04 NOTE — Patient Instructions (Signed)
Abdominal Pain During Pregnancy °Belly (abdominal) pain is common during pregnancy. Most of the time, it is not a serious problem. Other times, it can be a sign that something is wrong with the pregnancy. Always tell your doctor if you have belly pain. °HOME CARE °Monitor your belly pain for any changes. The following actions may help you feel better: °· Do not have sex (intercourse) or put anything in your vagina until you feel better. °· Rest until your pain stops. °· Drink clear fluids if you feel sick to your stomach (nauseous). Do not eat solid food until you feel better. °· Only take medicine as told by your doctor. °· Keep all doctor visits as told. °GET HELP RIGHT AWAY IF:  °· You are bleeding, leaking fluid, or pieces of tissue come out of your vagina. °· You have more pain or cramping. °· You keep throwing up (vomiting). °· You have pain when you pee (urinate) or have blood in your pee. °· You have a fever. °· You do not feel your baby moving as much. °· You feel very weak or feel like passing out. °· You have trouble breathing, with or without belly pain. °· You have a very bad headache and belly pain. °· You have fluid leaking from your vagina and belly pain. °· You keep having watery poop (diarrhea). °· Your belly pain does not go away after resting, or the pain gets worse. °MAKE SURE YOU:  °· Understand these instructions. °· Will watch your condition. °· Will get help right away if you are not doing well or get worse. °  °This information is not intended to replace advice given to you by your health care provider. Make sure you discuss any questions you have with your health care provider. °  °Document Released: 04/27/2009 Document Revised: 01/09/2013 Document Reviewed: 12/06/2012 °Elsevier Interactive Patient Education ©2016 Elsevier Inc. °First Trimester of Pregnancy °The first trimester of pregnancy is from week 1 until the end of week 12 (months 1 through 3). During this time, your baby will begin  to develop inside you. At 6-8 weeks, the eyes and face are formed, and the heartbeat can be seen on ultrasound. At the end of 12 weeks, all the baby's organs are formed. Prenatal care is all the medical care you receive before the birth of your baby. Make sure you get good prenatal care and follow all of your doctor's instructions. °HOME CARE  °Medicines °· Take medicine only as told by your doctor. Some medicines are safe and some are not during pregnancy. °· Take your prenatal vitamins as told by your doctor. °· Take medicine that helps you poop (stool softener) as needed if your doctor says it is okay. °Diet °· Eat regular, healthy meals. °· Your doctor will tell you the amount of weight gain that is right for you. °· Avoid raw meat and uncooked cheese. °· If you feel sick to your stomach (nauseous) or throw up (vomit): °· Eat 4 or 5 small meals a day instead of 3 large meals. °· Try eating a few soda crackers. °· Drink liquids between meals instead of during meals. °· If you have a hard time pooping (constipation): °· Eat high-fiber foods like fresh vegetables, fruit, and whole grains. °· Drink enough fluids to keep your pee (urine) clear or pale yellow. °Activity and Exercise °· Exercise only as told by your doctor. Stop exercising if you have cramps or pain in your lower belly (abdomen) or low back. °·   low back.  Try to avoid standing for long periods of time. Move your legs often if you must stand in one place for a long time.  Avoid heavy lifting.  Wear low-heeled shoes. Sit and stand up straight.  You can have sex unless your doctor tells you not to. Relief of Pain or Discomfort  Wear a good support bra if your breasts are sore.  Take warm water baths (sitz baths) to soothe pain or discomfort caused by hemorrhoids. Use hemorrhoid cream if your doctor says it is okay.  Rest with your legs raised if you have leg cramps or low back pain.  Wear support hose if you have puffy, bulging veins  (varicose veins) in your legs. Raise (elevate) your feet for 15 minutes, 3-4 times a day. Limit salt in your diet. Prenatal Care  Schedule your prenatal visits by the twelfth week of pregnancy.  Write down your questions. Take them to your prenatal visits.  Keep all your prenatal visits as told by your doctor. Safety  Wear your seat belt at all times when driving.  Make a list of emergency phone numbers. The list should include numbers for family, friends, the hospital, and police and fire departments. General Tips  Ask your doctor for a referral to a local prenatal class. Begin classes no later than at the start of month 6 of your pregnancy.  Ask for help if you need counseling or help with nutrition. Your doctor can give you advice or tell you where to go for help.  Do not use hot tubs, steam rooms, or saunas.  Do not douche or use tampons or scented sanitary pads.  Do not cross your legs for long periods of time.  Avoid litter boxes and soil used by cats.  Avoid all smoking, herbs, and alcohol. Avoid drugs not approved by your doctor.  Do not use any tobacco products, including cigarettes, chewing tobacco, and electronic cigarettes. If you need help quitting, ask your doctor. You may get counseling or other support to help you quit.  Visit your dentist. At home, brush your teeth with a soft toothbrush. Be gentle when you floss. GET HELP IF:  You are dizzy.  You have mild cramps or pressure in your lower belly.  You have a nagging pain in your belly area.  You continue to feel sick to your stomach, throw up, or have watery poop (diarrhea).  You have a bad smelling fluid coming from your vagina.  You have pain with peeing (urination).  You have increased puffiness (swelling) in your face, hands, legs, or ankles. GET HELP RIGHT AWAY IF:   You have a fever.  You are leaking fluid from your vagina.  You have spotting or bleeding from your vagina.  You have very  bad belly cramping or pain.  You gain or lose weight rapidly.  You throw up blood. It may look like coffee grounds.  You are around people who have [place] measles, fifth disease, or chickenpox.  You have a very bad headache.  You have shortness of breath.  You have any kind of trauma, such as from a fall or a car accident.   This information is not intended to replace advice given to you by your health care provider. Make sure you discuss any questions you have with your health care provider.   Document Released: 10/26/2007 Document Revised: 05/30/2014 Document Reviewed: 03/19/2013 Elsevier Interactive Patient Education [employer]2016 Elsevier Inc.

## 2016-02-24 ENCOUNTER — Encounter: Payer: Self-pay | Admitting: Family Medicine

## 2016-02-24 ENCOUNTER — Ambulatory Visit (INDEPENDENT_AMBULATORY_CARE_PROVIDER_SITE_OTHER): Payer: Medicaid Other | Admitting: Family Medicine

## 2016-02-24 ENCOUNTER — Other Ambulatory Visit: Payer: Self-pay | Admitting: Family Medicine

## 2016-02-24 VITALS — BP 125/82 | HR 90 | Wt 162.0 lb

## 2016-02-24 DIAGNOSIS — O09521 Supervision of elderly multigravida, first trimester: Secondary | ICD-10-CM

## 2016-02-24 DIAGNOSIS — O34219 Maternal care for unspecified type scar from previous cesarean delivery: Secondary | ICD-10-CM

## 2016-02-24 DIAGNOSIS — Z3689 Encounter for other specified antenatal screening: Secondary | ICD-10-CM

## 2016-02-24 DIAGNOSIS — O09899 Supervision of other high risk pregnancies, unspecified trimester: Secondary | ICD-10-CM | POA: Insufficient documentation

## 2016-02-24 DIAGNOSIS — O09211 Supervision of pregnancy with history of pre-term labor, first trimester: Secondary | ICD-10-CM

## 2016-02-24 DIAGNOSIS — O26891 Other specified pregnancy related conditions, first trimester: Secondary | ICD-10-CM

## 2016-02-24 DIAGNOSIS — O09219 Supervision of pregnancy with history of pre-term labor, unspecified trimester: Secondary | ICD-10-CM

## 2016-02-24 DIAGNOSIS — O09529 Supervision of elderly multigravida, unspecified trimester: Secondary | ICD-10-CM

## 2016-02-24 DIAGNOSIS — R109 Unspecified abdominal pain: Secondary | ICD-10-CM

## 2016-02-24 DIAGNOSIS — O0991 Supervision of high risk pregnancy, unspecified, first trimester: Secondary | ICD-10-CM | POA: Insufficient documentation

## 2016-02-24 NOTE — Progress Notes (Signed)
  Subjective:    Tammy Ray is a [id number] 8211w2d being seen today for her first obstetrical visit. This was not an intentional pregnancy. FOB involved. Her obstetrical history is significant for advanced maternal age and history of PPROM, history of classical cesarean section. Pregnancy history fully reviewed.  Patient reports nausea and abdominal pain. Has constant abdominal pain that started when she became pregnant. Had this abdominal pain with other pregnancies.   Vitals:   02/24/16 1459  BP: 125/82  Pulse: 90  Weight: 162 lb (73.5 kg)    HISTORY: OB History  Gravida Para Term Preterm AB Living  6 3 2 1 2 3   SAB TAB Ectopic Multiple Live Births    2     3    # Outcome Date GA Lbr Len/2nd Weight Sex Delivery Anes PTL Lv  6 Current           5 Preterm  823w2d    CS-Classical   LIV  4 Term      Vag-Spont   LIV  3 Term      Vag-Spont   LIV  2 TAB           1 TAB              Past Medical History:  Diagnosis Date  . Kidney infection   . UTI (lower urinary tract infection)    History reviewed. No pertinent surgical history. Family History  Problem Relation Age of Onset  . Cancer Maternal Aunt   . Cancer Maternal Grandmother      Exam    Uterus:     System:     Skin: normal coloration and turgor, no rashes    Neurologic: gait normal; reflexes normal and symmetric   Extremities: normal strength, tone, and muscle mass   HEENT PERRLA and extra ocular movement intact   Mouth/Teeth mucous membranes moist, pharynx normal without lesions   Neck supple and no masses   Cardiovascular: regular rate and rhythm, no murmurs or gallops   Respiratory:  appears well, vitals normal, no respiratory distress, acyanotic, normal RR, ear and throat exam is normal, neck free of mass or lymphadenopathy, chest clear, no wheezing, crepitations, rhonchi, normal symmetric air entry   Abdomen: Soft, tender throughout, mostly in the upper quadrants bilaterally. No rebound tenderness.  Slightly distended.          Assessment:    Pregnancy: [id number] Patient Active Problem List   Diagnosis Date Noted  . Supervision of high risk pregnancy in first trimester 02/24/2016  . AMA (advanced maternal age) multigravida 35+, unspecified trimester 02/24/2016  . H/O preterm delivery, currently pregnant 02/24/2016  . H/O cesarean section complicating pregnancy 02/24/2016  . Notalgia 01/20/2014  Abdominal pain in pregnancy      Plan:     Initial labs drawn. Prenatal vitamins. Problem list reviewed and updated.  After starting to talk to the patient, the patient confided that she was uncertain whether she wanted to keep this pregnancy due to all of the abdominal pain and not wanting to go through a repeat C-section. I discussed with her options of pregnancy termination and she requested information. We'll have her follow-up in one month. As she may decide to keep the pregnancy, will continue with prenatal labs. Follow-up in 4 weeks.  Greater than 50% of the 45 minute office visit was spent in counseling the patient  Tammy Ray, Tammy Ray 02/24/2016

## 2016-02-24 NOTE — Progress Notes (Signed)
Bedside ultrsound reveal CRL c/w 11.[redacted] weeks gestation. Fetal heart rate 162 bpm. Armandina StammerJennifer Karyme Mcconathy RNBSN

## 2016-02-25 LAB — OBSTETRIC PANEL
ANTIBODY SCREEN: NEGATIVE
BASOS ABS: 0 {cells}/uL (ref 0–200)
Basophils Relative: 0 %
EOS ABS: 0 {cells}/uL — AB (ref 15–500)
Eosinophils Relative: 0 %
HCT: 34.8 % — ABNORMAL LOW (ref 35.0–45.0)
HEMOGLOBIN: 12 g/dL (ref 11.7–15.5)
Hepatitis B Surface Ag: NEGATIVE
LYMPHS PCT: 26 %
Lymphs Abs: 1482 cells/uL (ref 850–3900)
MCH: 30.4 pg (ref 27.0–33.0)
MCHC: 34.5 g/dL (ref 32.0–36.0)
MCV: 88.1 fL (ref 80.0–100.0)
MONOS PCT: 8 %
MPV: 9.5 fL (ref 7.5–12.5)
Monocytes Absolute: 456 cells/uL (ref 200–950)
NEUTROS PCT: 66 %
Neutro Abs: 3762 cells/uL (ref 1500–7800)
PLATELETS: 349 10*3/uL (ref 140–400)
RBC: 3.95 MIL/uL (ref 3.80–5.10)
RDW: 12.7 % (ref 11.0–15.0)
RH TYPE: POSITIVE
RUBELLA: 14 {index} — AB (ref <0.90)
WBC: 5.7 10*3/uL (ref 3.8–10.8)

## 2016-02-25 LAB — SICKLE CELL SCREEN: SICKLE CELL SCREEN: NEGATIVE

## 2016-02-26 LAB — CULTURE, URINE COMPREHENSIVE

## 2016-02-27 LAB — PAIN MGMT, OPIATES EXP. QN, UR
CODEINE: NEGATIVE ng/mL (ref <50)
HYDROCODONE: NEGATIVE ng/mL (ref <50)
HYDROMORPHONE: NEGATIVE ng/mL (ref <50)
Morphine: NEGATIVE ng/mL (ref <50)
NOROXYCODONE: NEGATIVE ng/mL (ref <50)
Norhydrocodone: NEGATIVE ng/mL (ref <50)
Oxycodone: NEGATIVE ng/mL (ref <50)
Oxymorphone: NEGATIVE ng/mL (ref <50)

## 2016-03-28 ENCOUNTER — Encounter: Payer: Self-pay | Admitting: Obstetrics & Gynecology

## 2016-03-30 ENCOUNTER — Encounter: Payer: Self-pay | Admitting: Family Medicine

## 2016-03-30 ENCOUNTER — Ambulatory Visit (INDEPENDENT_AMBULATORY_CARE_PROVIDER_SITE_OTHER): Payer: Medicaid Other | Admitting: Family Medicine

## 2016-03-30 VITALS — BP 133/85 | HR 74 | Ht 66.0 in | Wt 172.0 lb

## 2016-03-30 DIAGNOSIS — F3289 Other specified depressive episodes: Secondary | ICD-10-CM | POA: Diagnosis not present

## 2016-03-30 DIAGNOSIS — Z01818 Encounter for other preprocedural examination: Secondary | ICD-10-CM | POA: Diagnosis not present

## 2016-03-30 DIAGNOSIS — Z3042 Encounter for surveillance of injectable contraceptive: Secondary | ICD-10-CM | POA: Diagnosis not present

## 2016-03-30 MED ORDER — MEDROXYPROGESTERONE ACETATE 150 MG/ML IM SUSP
150.0000 mg | Freq: Once | INTRAMUSCULAR | Status: AC
  Administered 2016-03-30: 150 mg via INTRAMUSCULAR

## 2016-03-30 NOTE — Progress Notes (Signed)
   Subjective:    Patient ID: Tammy Ray, female    DOB: 07-Oct-1980, 35 y.o.   MRN: [id number]  HPI Patient seen for BTL evaluation. Was pregnant, then had a TAB on 03/01/16. She has stopped bleeding a couple of weeks ago. Other forms of contraception cause side effects.   She is currently go through a separation with her partner. She has some depression in relation to this. Would like to see a Veterinary surgeoncounselor. Denies SI/HI.   Review of Systems     Objective:   Physical Exam  Constitutional: She is oriented to person, place, and time. She appears well-developed and well-nourished.  HENT:  Head: Normocephalic and atraumatic.  Right Ear: External ear normal.  Cardiovascular: Normal rate, regular rhythm and normal heart sounds.   Pulmonary/Chest: Effort normal and breath sounds normal. No respiratory distress. She has no wheezes. She has no rales.  Abdominal: Soft. There is no tenderness. There is no rebound.  Neurological: She is alert and oriented to person, place, and time.  Skin: Skin is warm and dry.  Psychiatric: She has a normal mood and affect. Her behavior is normal. Judgment and thought content normal.       Assessment & Plan:  1. Pre-operative evaluation for tubal ligation Medicaid papers signed. Will give patient a depo shot after negative urine pregnancy test. Procedure discussed, as well as other options such as LARCs. Patient would like BTL due to complications within pregnancy. We'll schedule after 30 days.  2. Other depression Will refer to behavior health

## 2016-03-30 NOTE — Addendum Note (Signed)
Addended by: Levie HeritageSTINSON, Nikeisha Klutz J on: 03/30/2016 03:06 PM   Modules accepted: Orders

## 2016-03-30 NOTE — Progress Notes (Signed)
Patient would like tubal ligation. Armandina StammerJennifer Howard RNBSN  UPT- negative. Armandina StammerJennifer Howard RNBSN

## 2016-03-30 NOTE — Addendum Note (Signed)
Addended by: Anell BarrHOWARD, JENNIFER L on: 03/30/2016 03:09 PM   Modules accepted: Orders

## 2016-04-01 ENCOUNTER — Encounter (HOSPITAL_COMMUNITY): Payer: Self-pay | Admitting: *Deleted

## 2016-04-04 ENCOUNTER — Ambulatory Visit (INDEPENDENT_AMBULATORY_CARE_PROVIDER_SITE_OTHER): Payer: Medicaid Other | Admitting: Clinical

## 2016-04-04 DIAGNOSIS — F4323 Adjustment disorder with mixed anxiety and depressed mood: Secondary | ICD-10-CM | POA: Diagnosis not present

## 2016-04-04 NOTE — BH Specialist Note (Signed)
Session Start time: 3:00  End Time: 4:00 Total Time:  60 minutes Type of Service: Behavioral Health - Individual/Family Interpreter: No.   Interpreter Name & Language: n/a # [facility]   SUBJECTIVE: Tammy Ray is a 35 y.o. female  Pt. was referred by Tammy CelesteJacob Stinson, DO for:  depression. Pt. 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  Pt may benefit from psychoeducation and brief therapeutic interventions regarding coping with symptoms of anxiety and depression.      PLAN: 1. F/U with behavioral health  clinician: In two weeks 2. Behavioral Health meds: none 3. Behavioral recommendations:  -Begin taking Vitamin D, as prescribed by medical provider -Daily CALM relaxation breathing exercise -Consider Worry Hour technique to prioritize stressors -Read educational material regarding coping with symptoms of depression and anxiety with panic attacks 4. Referral: Brief Counseling/Psychotherapy, Publishing rights managerCommunity Resource and Psychoeducation 5. From scale of 1-10, how likely are you to follow plan: 9   Woc-Behavioral Health Clinician  Behavioral Health Clinician  Warmhandoff: no

## 2016-05-11 ENCOUNTER — Other Ambulatory Visit: Payer: Self-pay | Admitting: Obstetrics and Gynecology

## 2016-05-11 NOTE — Patient Instructions (Signed)
Your procedure is scheduled on:  Thursday, Jan. 4, 2018  [facility] at:  11:30 AM  Pick up the phone at the desk and dial [phone].  Call this number if you have problems the morning of surgery: [phone].  Remember: Do NOT eat food:  After Midnight Wednesday, Jan. 3, 2018  Do NOT drink clear liquids after:  9:00 AM day of surgery  Take these medicines the morning of surgery with a SIP OF WATER:  None  Stop ALL herbal medications at this time   Do NOT wear jewelry (body piercing), metal hair clips/bobby pins, make-up, or nail polish. Do NOT wear lotions, powders, or perfumes.  You may wear deodorant. Do NOT shave for 48 hours prior to surgery. Do NOT bring valuables to the hospital. Contacts, dentures, or bridgework may not be worn into surgery.  Have a responsible adult drive you home and stay with you for 24 hours after your procedure

## 2016-05-12 ENCOUNTER — Encounter (HOSPITAL_COMMUNITY): Payer: Self-pay

## 2016-05-12 ENCOUNTER — Encounter (HOSPITAL_COMMUNITY)
Admission: RE | Admit: 2016-05-12 | Discharge: 2016-05-12 | Disposition: A | Discharge status: Home / Self Care | Payer: Medicaid Other | Source: Ambulatory Visit | Attending: Obstetrics and Gynecology | Admitting: Obstetrics and Gynecology

## 2016-05-12 DIAGNOSIS — Z01812 Encounter for preprocedural laboratory examination: Secondary | ICD-10-CM | POA: Diagnosis not present

## 2016-05-12 HISTORY — DX: Major depressive disorder, single episode, unspecified: F32.9

## 2016-05-12 HISTORY — DX: Depression, unspecified: F32.A

## 2016-05-12 HISTORY — DX: Anxiety disorder, unspecified: F41.9

## 2016-05-12 HISTORY — DX: Cramp and spasm: R25.2

## 2016-05-12 LAB — CBC
HCT: 39.4 % (ref 36.0–46.0)
HEMOGLOBIN: 13 g/dL (ref 12.0–15.0)
MCH: 30 pg (ref 26.0–34.0)
MCHC: 33 g/dL (ref 30.0–36.0)
MCV: 91 fL (ref 78.0–100.0)
Platelets: 274 10*3/uL (ref 150–400)
RBC: 4.33 MIL/uL (ref 3.87–5.11)
RDW: 12.5 % (ref 11.5–15.5)
WBC: 5.1 10*3/uL (ref 4.0–10.5)

## 2016-05-26 ENCOUNTER — Ambulatory Visit (HOSPITAL_COMMUNITY): Payer: Medicaid Other | Admitting: Anesthesiology

## 2016-05-26 ENCOUNTER — Encounter (HOSPITAL_COMMUNITY)
Admission: RE | Disposition: A | Discharge status: Home / Self Care | Payer: Self-pay | Source: Ambulatory Visit | Attending: Obstetrics and Gynecology

## 2016-05-26 ENCOUNTER — Encounter (HOSPITAL_COMMUNITY): Payer: Self-pay | Admitting: Anesthesiology

## 2016-05-26 ENCOUNTER — Ambulatory Visit (HOSPITAL_COMMUNITY)
Admission: RE | Admit: 2016-05-26 | Discharge: 2016-05-26 | Disposition: A | Discharge status: Home / Self Care | Payer: Medicaid Other | Source: Ambulatory Visit | Attending: Obstetrics and Gynecology | Admitting: Obstetrics and Gynecology

## 2016-05-26 DIAGNOSIS — Z87891 Personal history of nicotine dependence: Secondary | ICD-10-CM | POA: Diagnosis not present

## 2016-05-26 DIAGNOSIS — F329 Major depressive disorder, single episode, unspecified: Secondary | ICD-10-CM | POA: Diagnosis not present

## 2016-05-26 DIAGNOSIS — Z302 Encounter for sterilization: Secondary | ICD-10-CM

## 2016-05-26 DIAGNOSIS — F419 Anxiety disorder, unspecified: Secondary | ICD-10-CM | POA: Insufficient documentation

## 2016-05-26 HISTORY — PX: LAPAROSCOPIC TUBAL LIGATION: SHX1937

## 2016-05-26 LAB — PREGNANCY, URINE: PREG TEST UR: NEGATIVE

## 2016-05-26 SURGERY — LIGATION, FALLOPIAN TUBE, LAPAROSCOPIC
Anesthesia: General | Site: Abdomen | Laterality: Bilateral

## 2016-05-26 MED ORDER — OXYCODONE HCL 5 MG/5ML PO SOLN
5.0000 mg | Freq: Once | ORAL | Status: DC | PRN
Start: 2016-05-26 — End: 2016-05-26

## 2016-05-26 MED ORDER — PROPOFOL 10 MG/ML IV BOLUS
INTRAVENOUS | Status: DC | PRN
  Administered 2016-05-26: 170 mg via INTRAVENOUS

## 2016-05-26 MED ORDER — HYDROMORPHONE HCL 1 MG/ML IJ SOLN
INTRAMUSCULAR | Status: AC
  Filled 2016-05-26: qty 1

## 2016-05-26 MED ORDER — LACTATED RINGERS IV SOLN
INTRAVENOUS | Status: DC
  Administered 2016-05-26 (×2): via INTRAVENOUS

## 2016-05-26 MED ORDER — MIDAZOLAM HCL 2 MG/2ML IJ SOLN
INTRAMUSCULAR | Status: DC | PRN
  Administered 2016-05-26: 2 mg via INTRAVENOUS

## 2016-05-26 MED ORDER — ROCURONIUM BROMIDE 100 MG/10ML IV SOLN
INTRAVENOUS | Status: DC | PRN
  Administered 2016-05-26: 35 mg via INTRAVENOUS

## 2016-05-26 MED ORDER — ROCURONIUM BROMIDE 100 MG/10ML IV SOLN
INTRAVENOUS | Status: AC
  Filled 2016-05-26: qty 1

## 2016-05-26 MED ORDER — HYDROCODONE-ACETAMINOPHEN 5-325 MG PO TABS: 1.0000 | ORAL_TABLET | Freq: Four times a day (QID) | ORAL | 0 refills | Status: DC | PRN

## 2016-05-26 MED ORDER — FENTANYL CITRATE (PF) 250 MCG/5ML IJ SOLN
INTRAMUSCULAR | Status: AC
  Filled 2016-05-26: qty 5

## 2016-05-26 MED ORDER — SUGAMMADEX SODIUM 200 MG/2ML IV SOLN
INTRAVENOUS | Status: DC | PRN
  Administered 2016-05-26: 150.6 mg via INTRAVENOUS

## 2016-05-26 MED ORDER — MIDAZOLAM HCL 2 MG/2ML IJ SOLN
INTRAMUSCULAR | Status: AC
  Filled 2016-05-26: qty 2

## 2016-05-26 MED ORDER — DEXTROSE IN LACTATED RINGERS 5 % IV SOLN: INTRAVENOUS | Status: DC

## 2016-05-26 MED ORDER — HYDROMORPHONE HCL 1 MG/ML IJ SOLN
0.2500 mg | INTRAMUSCULAR | Status: DC | PRN
  Administered 2016-05-26 (×4): 0.25 mg via INTRAVENOUS

## 2016-05-26 MED ORDER — FENTANYL CITRATE (PF) 100 MCG/2ML IJ SOLN
INTRAMUSCULAR | Status: DC | PRN
  Administered 2016-05-26 (×2): 50 ug via INTRAVENOUS
  Administered 2016-05-26: 100 ug via INTRAVENOUS
  Administered 2016-05-26: 50 ug via INTRAVENOUS

## 2016-05-26 MED ORDER — PROPOFOL 10 MG/ML IV BOLUS
INTRAVENOUS | Status: AC
  Filled 2016-05-26: qty 20

## 2016-05-26 MED ORDER — PROMETHAZINE HCL 25 MG/ML IJ SOLN
6.2500 mg | INTRAMUSCULAR | Status: DC | PRN
Start: 2016-05-26 — End: 2016-05-26

## 2016-05-26 MED ORDER — IBUPROFEN 800 MG PO TABS: 800.0000 mg | ORAL_TABLET | Freq: Three times a day (TID) | ORAL | 0 refills | Status: DC | PRN

## 2016-05-26 MED ORDER — LIDOCAINE HCL (CARDIAC) 20 MG/ML IV SOLN
INTRAVENOUS | Status: AC
  Filled 2016-05-26: qty 5

## 2016-05-26 MED ORDER — KETOROLAC TROMETHAMINE 30 MG/ML IJ SOLN
INTRAMUSCULAR | Status: DC | PRN
  Administered 2016-05-26: 30 mg via INTRAVENOUS

## 2016-05-26 MED ORDER — SCOPOLAMINE 1 MG/3DAYS TD PT72
1.0000 | MEDICATED_PATCH | Freq: Once | TRANSDERMAL | Status: DC
  Administered 2016-05-26: 1.5 mg via TRANSDERMAL

## 2016-05-26 MED ORDER — BUPIVACAINE HCL 0.5 % IJ SOLN
INTRAMUSCULAR | Status: DC | PRN
  Administered 2016-05-26: 10 mL

## 2016-05-26 MED ORDER — LACTATED RINGERS IV SOLN: INTRAVENOUS | Status: DC

## 2016-05-26 MED ORDER — LIDOCAINE HCL (CARDIAC) 20 MG/ML IV SOLN
INTRAVENOUS | Status: DC | PRN
  Administered 2016-05-26: 100 mg via INTRAVENOUS

## 2016-05-26 MED ORDER — ONDANSETRON HCL 4 MG/2ML IJ SOLN
INTRAMUSCULAR | Status: DC | PRN
  Administered 2016-05-26: 4 mg via INTRAVENOUS

## 2016-05-26 MED ORDER — SOD CITRATE-CITRIC ACID 500-334 MG/5ML PO SOLN
30.0000 mL | ORAL | Status: AC
  Administered 2016-05-26: 30 mL via ORAL

## 2016-05-26 MED ORDER — SOD CITRATE-CITRIC ACID 500-334 MG/5ML PO SOLN
ORAL | Status: AC
  Administered 2016-05-26: 30 mL via ORAL
  Filled 2016-05-26: qty 15

## 2016-05-26 MED ORDER — KETOROLAC TROMETHAMINE 30 MG/ML IJ SOLN
INTRAMUSCULAR | Status: AC
  Filled 2016-05-26: qty 1

## 2016-05-26 MED ORDER — SUGAMMADEX SODIUM 200 MG/2ML IV SOLN
INTRAVENOUS | Status: AC
  Filled 2016-05-26: qty 2

## 2016-05-26 MED ORDER — SCOPOLAMINE 1 MG/3DAYS TD PT72
MEDICATED_PATCH | TRANSDERMAL | Status: AC
  Administered 2016-05-26: 1.5 mg via TRANSDERMAL
  Filled 2016-05-26: qty 1

## 2016-05-26 MED ORDER — DEXAMETHASONE SODIUM PHOSPHATE 4 MG/ML IJ SOLN
INTRAMUSCULAR | Status: AC
  Filled 2016-05-26: qty 1

## 2016-05-26 MED ORDER — OXYCODONE HCL 5 MG PO TABS: 5.0000 mg | ORAL_TABLET | Freq: Once | ORAL | Status: DC | PRN

## 2016-05-26 MED ORDER — ONDANSETRON HCL 4 MG/2ML IJ SOLN
INTRAMUSCULAR | Status: AC
  Filled 2016-05-26: qty 2

## 2016-05-26 MED ORDER — MEPERIDINE HCL 25 MG/ML IJ SOLN: 6.2500 mg | INTRAMUSCULAR | Status: DC | PRN

## 2016-05-26 MED ORDER — DEXAMETHASONE SODIUM PHOSPHATE 10 MG/ML IJ SOLN
INTRAMUSCULAR | Status: DC | PRN
  Administered 2016-05-26: 4 mg via INTRAVENOUS

## 2016-05-26 SURGICAL SUPPLY — 19 items
CATH ROBINSON RED A/P 16FR (CATHETERS) ×3 IMPLANT
CLOTH BEACON ORANGE TIMEOUT ST (SAFETY) ×3 IMPLANT
DERMABOND ADVANCED (GAUZE/BANDAGES/DRESSINGS) ×2
DERMABOND ADVANCED .7 DNX12 (GAUZE/BANDAGES/DRESSINGS) ×1 IMPLANT
DRSG OPSITE POSTOP 3X4 (GAUZE/BANDAGES/DRESSINGS) ×3 IMPLANT
DURAPREP 26ML APPLICATOR (WOUND CARE) ×3 IMPLANT
GLOVE BIO SURGEON STRL SZ7.5 (GLOVE) ×3 IMPLANT
GLOVE BIOGEL PI IND STRL 7.0 (GLOVE) ×2 IMPLANT
GLOVE BIOGEL PI INDICATOR 7.0 (GLOVE) ×4
GOWN STRL REUS W/TWL LRG LVL3 (GOWN DISPOSABLE) ×3 IMPLANT
GOWN STRL REUS W/TWL XL LVL3 (GOWN DISPOSABLE) ×3 IMPLANT
PACK LAPAROSCOPY BASIN (CUSTOM PROCEDURE TRAY) ×3 IMPLANT
PROTECTOR NERVE ULNAR (MISCELLANEOUS) ×3 IMPLANT
SUT MNCRL AB 4-0 PS2 18 (SUTURE) ×3 IMPLANT
SUT VICRYL 0 UR6 27IN ABS (SUTURE) ×6 IMPLANT
TOWEL OR 17X24 6PK STRL BLUE (TOWEL DISPOSABLE) ×6 IMPLANT
TROCAR BALLN 12MMX100 BLUNT (TROCAR) ×3 IMPLANT
WARMER LAPAROSCOPE (MISCELLANEOUS) ×3 IMPLANT
WATER STERILE IRR 1000ML POUR (IV SOLUTION) ×3 IMPLANT

## 2016-05-26 NOTE — Transfer of Care (Signed)
Immediate Anesthesia Transfer of Care Note  Patient: Tammy Ray  Procedure(s) Performed: Procedure(s): LAPAROSCOPIC TUBAL LIGATION (Bilateral)  Patient Location: PACU  Anesthesia Type:General  Level of Consciousness: awake, alert  and oriented  Airway & Oxygen Therapy: Patient Spontanous Breathing and Patient connected to nasal cannula oxygen  Post-op Assessment: Report given to RN, Post -op Vital signs reviewed and stable and Patient moving all extremities  Post vital signs: Reviewed and stable  Last Vitals:  Vitals:   05/26/16 1139  BP: (!) 129/93  Pulse: 74  Resp: 16  Temp: 36.9 C    Last Pain:  Vitals:   05/26/16 1139  TempSrc: Oral      Patients Stated Pain Goal: 3 (05/26/16 1139)  Complications: No apparent anesthesia complications

## 2016-05-26 NOTE — Anesthesia Postprocedure Evaluation (Signed)
Anesthesia Post Note  Patient: Tammy Ray  Procedure(s) Performed: Procedure(s) (LRB): LAPAROSCOPIC TUBAL LIGATION (Bilateral)  Patient location during evaluation: PACU Anesthesia Type: General Level of consciousness: awake and alert Pain management: pain level controlled Vital Signs Assessment: post-procedure vital signs reviewed and stable Respiratory status: spontaneous breathing, nonlabored ventilation, respiratory function stable and patient connected to nasal cannula oxygen Cardiovascular status: blood pressure returned to baseline and stable Postop Assessment: no signs of nausea or vomiting Anesthetic complications: no        Last Vitals:  Vitals:   05/26/16 1600 05/26/16 1635  BP: (!) 123/93 124/73  Pulse: 74 67  Resp: 11 12  Temp: 36.6 C 36.6 C    Last Pain:  Vitals:   05/26/16 1600  TempSrc:   PainSc: 1    Pain Goal: Patients Stated Pain Goal: 3 (05/26/16 1515)               Shelton SilvasKevin D Robin Pafford

## 2016-05-26 NOTE — H&P (Signed)
Tammy MesaCherelle Justiniano is an 36 y.o. female [id number] who presents for Laparoscopic BTL.   Pertinent Gynecological History: OB History: G6, P2133   Menstrual History: Menarche age: [age] Patient's last menstrual period was 01/09/2016.    Past Medical History:  Diagnosis Date  . Anxiety   . Bilateral leg cramps   . Depression   . Kidney infection   . UTI (lower urinary tract infection)     Past Surgical History:  Procedure Laterality Date  . CESAREAN SECTION      Family History  Problem Relation Age of Onset  . Cancer Maternal Aunt   . Cancer Maternal Grandmother     Social History:  reports that she quit smoking about 2 years ago. She has never used smokeless tobacco. She reports that she drinks alcohol. She reports that she does not use drugs.  Allergies:  Allergies  Allergen Reactions  . Oatmeal Rash    Prescriptions Prior to Admission  Medication Sig Dispense Refill Last Dose  . Ascorbic Acid (VITA-C PO) Take 1 tablet by mouth daily.   Past Week at Unknown time  . Cholecalciferol (VITAMIN D PO) Take 1 tablet by mouth daily.   Past Month at Unknown time  . Cyanocobalamin (B-12 PO) Take 1 tablet by mouth daily.   Past Week at Unknown time    Review of Systems  Constitutional: Negative.   Respiratory: Negative.   Cardiovascular: Negative.   Gastrointestinal: Negative.   Genitourinary: Negative.     Blood pressure (!) 129/93, pulse 74, temperature 98.4 F (36.9 C), temperature source Oral, resp. rate 16, last menstrual period 01/09/2016, SpO2 100 %, unknown if currently breastfeeding. Physical Exam  Constitutional: She appears well-developed and well-nourished.  Cardiovascular: Normal rate and regular rhythm.   Respiratory: Effort normal and breath sounds normal.  GI: Soft. Bowel sounds are normal.  Genitourinary: Vagina normal and uterus normal.    Results for orders placed or performed during the hospital encounter of 05/26/16 (from the past 24 hour(s))   Pregnancy, urine     Status: None   Collection Time: 05/26/16 11:30 AM  Result Value Ref Range   Preg Test, Ur NEGATIVE NEGATIVE    No results found.  Assessment/Plan: Undesired fertility   Laparoscopic BTL via coagulation discussed with pt. R/B/Failure rate and post op care reviewed. Pt verbalized understanding and desires to proceed. Hermina StaggersMichael L Lavonta Tillis 05/26/2016, 12:13 PM

## 2016-05-26 NOTE — Discharge Instructions (Signed)
Diagnostic Laparoscopy, Care After °Introduction °Refer to this sheet in the next few weeks. These instructions provide you with information about caring for yourself after your procedure. Your health care provider may also give you more specific instructions. Your treatment has been planned according to current medical practices, but problems sometimes occur. Call your health care provider if you have any problems or questions after your procedure. °What can I expect after the procedure? °After your procedure, it is common to have mild discomfort in the throat and abdomen. °Follow these instructions at home: °· Take over-the-counter and prescription medicines only as told by your health care provider. °· Do not drive for 24 hours if you received a sedative. °· Return to your normal activities as told by your health care provider. °· Do not take baths, swim, or use a hot tub until your health care provider approves. You may shower. °· Follow instructions from your health care provider about how to take care of your incision. Make sure you: °¨ Wash your hands with soap and water before you change your bandage (dressing). If soap and water are not available, use hand sanitizer. °¨ Change your dressing as told by your health care provider. °¨ Leave stitches (sutures), skin glue, or adhesive strips in place. These skin closures may need to stay in place for 2 weeks or longer. If adhesive strip edges start to loosen and curl up, you may trim the loose edges. Do not remove adhesive strips completely unless your health care provider tells you to do that. °· Check your incision area every day for signs of infection. Check for: °¨ More redness, swelling, or pain. °¨ More fluid or blood. °¨ Warmth. °¨ Pus or a bad smell. °· It is your responsibility to get the results of your procedure. Ask your health care provider or the department performing the procedure when your results will be ready. °Contact a health care provider  if: °· There is new pain in your shoulders. °· You feel light-headed or faint. °· You are unable to pass gas or unable to have a bowel movement. °· You feel nauseous or you vomit. °· You develop a rash. °· You have more redness, swelling, or pain around your incision. °· You have more fluid or blood coming from your incision. °· Your incision feels warm to the touch. °· You have pus or a bad smell coming from your incision. °· You have a fever or chills. °Get help right away if: °· Your pain is getting worse. °· You have ongoing vomiting. °· The edges of your incision open up. °· You have trouble breathing. °· You have chest pain. °This information is not intended to replace advice given to you by your health care provider. Make sure you discuss any questions you have with your health care provider. °Document Released: 04/20/2015 Document Revised: 10/15/2015 Document Reviewed: 01/20/2015 °© 2017 Elsevier ° °

## 2016-05-26 NOTE — Op Note (Signed)
Calyssa Wiegel 05/26/2016  PREOPERATIVE DIAGNOSIS:  Undesired fertility  POSTOPERATIVE DIAGNOSIS:  Undesired fertility  PROCEDURE:  Laparoscopic Bilateral Tubal Sterilization using Bipolar Coagulation  ANESTHESIA:  General endotracheal  SURGEON: Janith Nielson L. Alysia PennaErvin, MD  COMPLICATIONS:  None immediate.  ESTIMATED BLOOD LOSS:  Less than 20 ml.  FLUIDS: 1000 ml LR.  URINE OUTPUT:    INDICATIONS: 36 y.o. [id number]  with undesired fertility, desires permanent sterilization. Other reversible forms of contraception were discussed with patient; she declines all other modalities.  Risks of procedure discussed with patient including permanence of method, bleeding, infection, injury to surrounding organs and need for additional procedures including laparotomy, risk of regret.  Failure risk of 0.5-1% with increased risk of ectopic gestation if pregnancy occurs was also discussed with patient.      FINDINGS:  Normal uterus, tubes, and ovaries.  TECHNIQUE:  The patient was taken to the operating room where general anesthesia was obtained without difficulty.  She was then placed in the dorsal lithotomy position and prepared and draped in sterile fashion.  After an adequate timeout was performed, a bivalved speculum was then placed in the patient's vagina, and the anterior lip of cervix grasped with the single-tooth tenaculum.  The uterine manipulator was then advanced into the uterus.  The speculum and tenaculum were removed from the vagina.  Attention was then turned to the patient's abdomen where a 11-mm skin incision was made in the umbilical fold.  S retractors were placed exposing the fascia . Fascia was grasped and cut. Conners were secured with Vicryl. Peritoneum was grasp and enter was gained.  A Huszon Trocar was then placed without problems. Fascia was secured to the trocar.  The abdomen was then insufflated with carbon dioxide gas and adequate pneumoperitoneum was obtained.  A survey of the  patient's pelvis and abdomen revealed entirely normal anatomy.   The fallopian tubes were observed and found to be normal in appearance. Bipolar forceps was then advanced through the operative port and used to coagulate a 3 cm portion of the left tube in the mid isthmic area.  Good blanching and coagulation was noted at the site of the application.  There was no bleeding noted in the mesosalpinx.  A similar process was carried out on the right fallopian tube. Good hemostasis was noted overall.  .The instruments were then removed from the patient's abdomen and the fascial incision was closed with the Vicryl suture.The SQ tissue was closed with interrupted Vicryl. epaired with 0 Vicryl. The skin was closed with Monocryl. Dressing was applied to the incision site.   The uterine manipulator was removed from the vagina without complications. The patient tolerated the procedure well.  Sponge, lap, and needle counts were correct times two.  The patient was then taken to the recovery room awake, extubated and in stable condition.

## 2016-05-26 NOTE — Anesthesia Preprocedure Evaluation (Addendum)
Anesthesia Evaluation  Patient identified by MRN, date of birth, ID band Patient awake    Reviewed: Allergy & Precautions, NPO status , Patient's Chart, lab work & pertinent test results  Airway Mallampati: II  TM Distance: >3 FB Neck ROM: Full    Dental  (+) Teeth Intact, Dental Advisory Given   Pulmonary former smoker,    breath sounds clear to auscultation       Cardiovascular negative cardio ROS   Rhythm:Regular Rate:Normal     Neuro/Psych PSYCHIATRIC DISORDERS Anxiety Depression negative neurological ROS     GI/Hepatic negative GI ROS, Neg liver ROS,   Endo/Other  negative endocrine ROS  Renal/GU   negative genitourinary   Musculoskeletal negative musculoskeletal ROS (+)   Abdominal   Peds negative pediatric ROS (+)  Hematology negative hematology ROS (+)   Anesthesia Other Findings H/o UTI's  Reproductive/Obstetrics negative OB ROS                            Anesthesia Physical Anesthesia Plan  ASA: II  Anesthesia Plan: General   Post-op Pain Management:    Induction: Intravenous  Airway Management Planned: Oral ETT  Additional Equipment:   Intra-op Plan:   Post-operative Plan: Extubation in OR  Informed Consent: I have reviewed the patients History and Physical, chart, labs and discussed the procedure including the risks, benefits and alternatives for the proposed anesthesia with the patient or authorized representative who has indicated his/her understanding and acceptance.   Dental advisory given  Plan Discussed with: CRNA  Anesthesia Plan Comments:         Anesthesia Quick Evaluation

## 2016-05-27 ENCOUNTER — Encounter (HOSPITAL_COMMUNITY): Payer: Self-pay | Admitting: Obstetrics and Gynecology

## 2016-06-17 ENCOUNTER — Encounter (HOSPITAL_COMMUNITY): Payer: Self-pay

## 2016-06-24 ENCOUNTER — Ambulatory Visit: Payer: Medicaid Other | Admitting: Family Medicine

## 2016-06-24 ENCOUNTER — Encounter: Payer: Self-pay | Admitting: Family Medicine

## 2016-06-24 VITALS — BP 118/79 | HR 82 | Wt 166.0 lb

## 2016-06-24 DIAGNOSIS — Z4889 Encounter for other specified surgical aftercare: Secondary | ICD-10-CM

## 2016-06-24 NOTE — Progress Notes (Signed)
   Subjective:    Patient ID: Tammy Ray, female    DOB: 05/22/1981, 36 y.o.   MRN: [id number]  HPI Patient seen 4 weeks postop from otoscopic tubal ligation. She reports minimal discomfort to the incision site - pain has been improving. No other complications. Has not resumed sex. No nausea or vomiting,   Review of Systems     Objective:   Physical Exam  Constitutional: She appears well-developed and well-nourished.  Cardiovascular: Normal rate.   Pulmonary/Chest: Effort normal. No respiratory distress.  Abdominal: Soft. She exhibits no mass. There is tenderness (mild tenderness to incision site). There is no rebound and no guarding.  incision clean, dry, intact    Assessment & Plan:  1. Encounter for postoperative wound check Appears normal. Pt okay to resume sex. F/u prn

## 2016-10-21 NOTE — Anesthesia Postprocedure Evaluation (Signed)
Anesthesia Post Note  Patient: Tammy Ray  Procedure(s) Performed: Procedure(s) (LRB): LAPAROSCOPIC TUBAL LIGATION (Bilateral)     Anesthesia Post Evaluation  Last Vitals:  Vitals:   05/26/16 1635 05/26/16 1654  BP: 124/73 119/71  Pulse: 67 72  Resp: 12   Temp: 36.6 C     Last Pain:  Vitals:   05/27/16 1411  TempSrc:   PainSc: 6                  Shelton SilvasKevin D Ayaansh Smail

## 2016-10-21 NOTE — Addendum Note (Signed)
Addendum  created 10/21/16 0830 by Hollis, Kevin D, MD   Sign clinical note    

## 2018-03-22 IMAGING — US US OB TRANSVAGINAL
1 series · 15 of 28 positions shown · non-contrast
Comparison: None.

CLINICAL DATA: 35-year-old female with low back pain and spotting.

EXAM:
OBSTETRIC <14 WK ULTRASOUND
TECHNIQUE: Transabdominal ultrasound was performed for evaluation of the
gestation as well as the maternal uterus and adnexal regions.

[Series 1: us ob transvaginal · 15 of 59 slices shown]
[im 1/59]
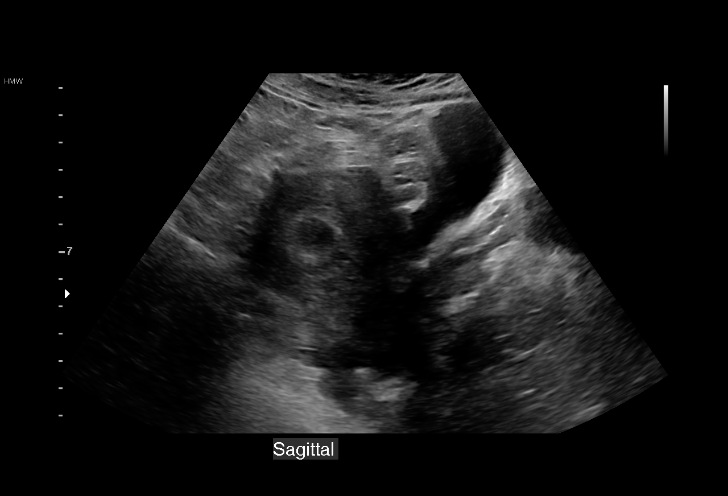
[im 5/59]
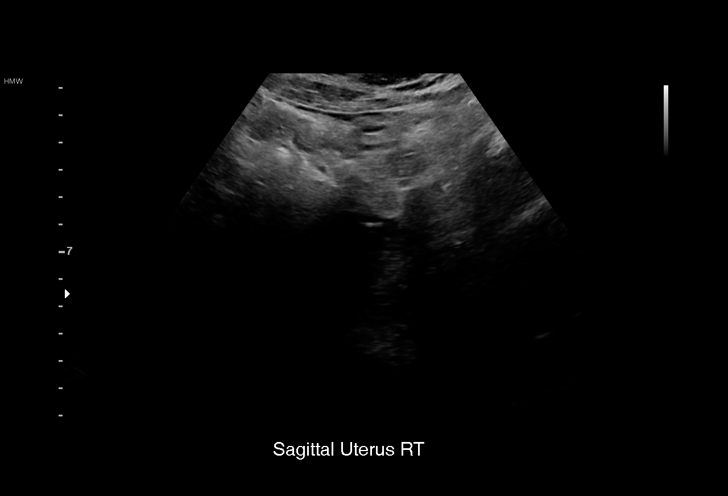
[im 9/59]
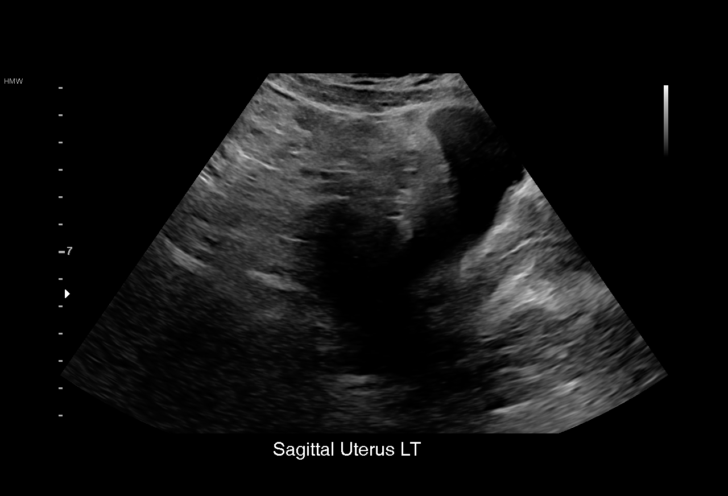
[im 13/59]
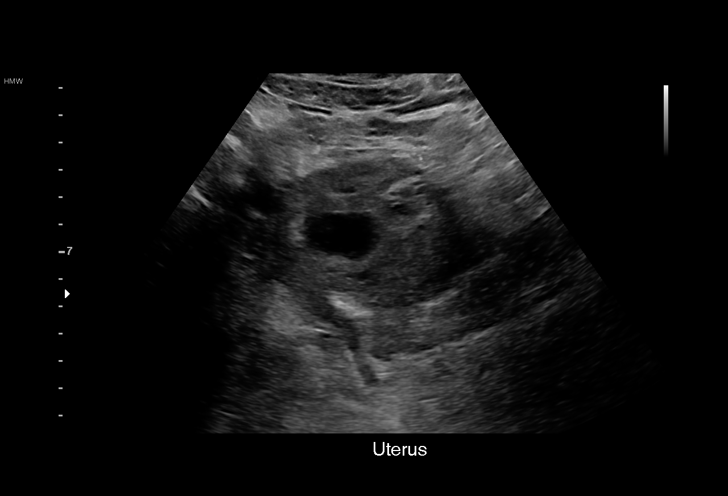
[im 18/59]
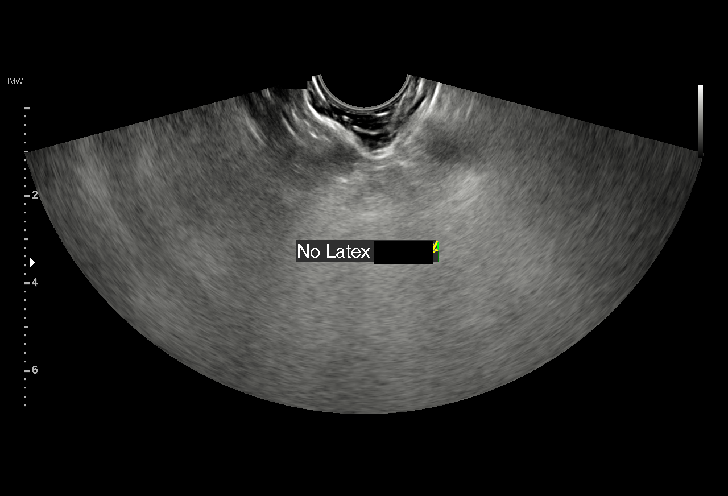
[im 22/59]
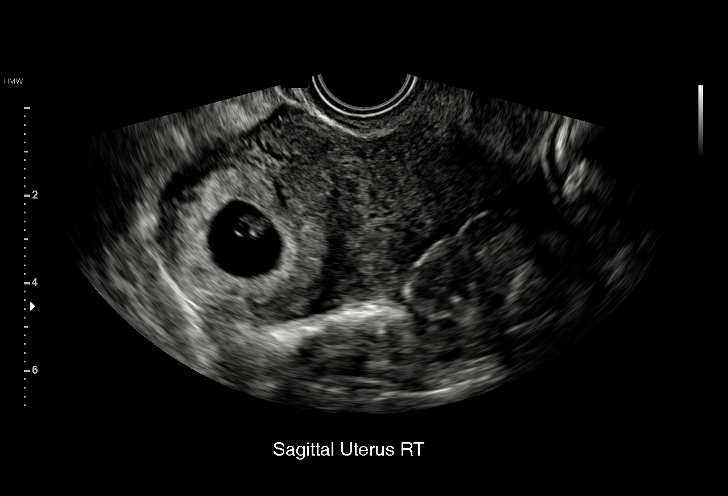
[im 26/59]
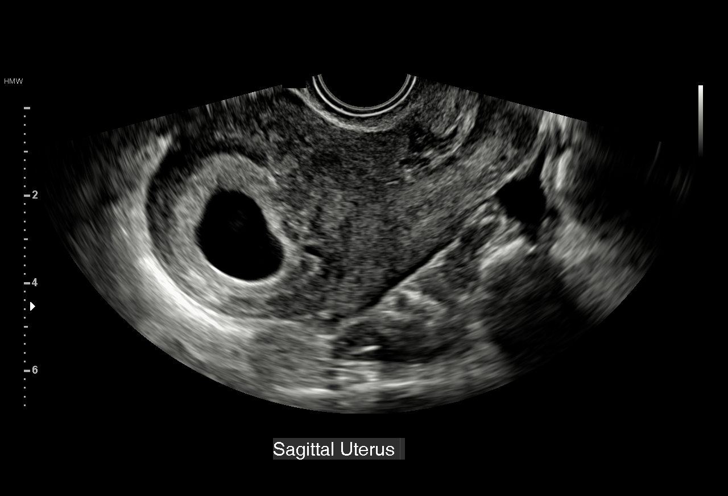
[im 31/59]
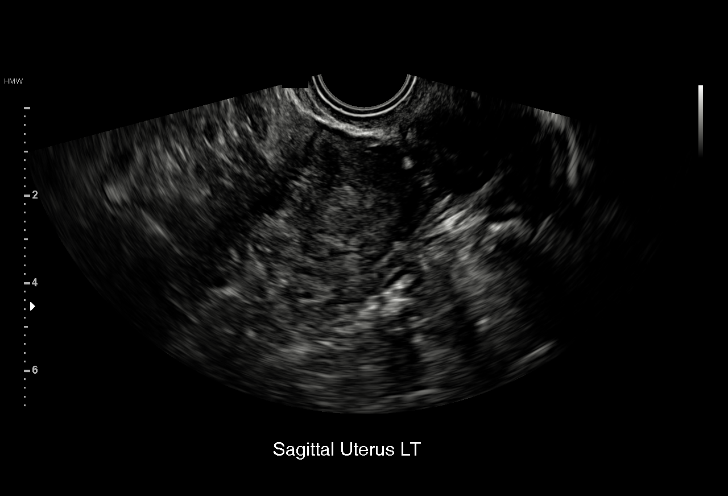
[im 33/59]
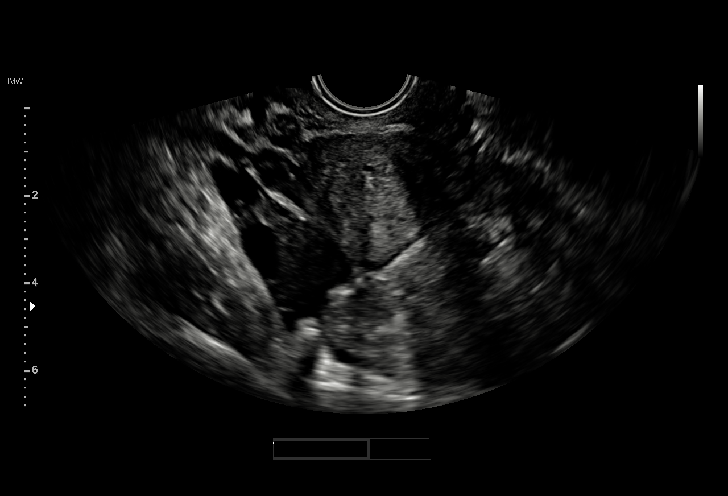
[im 37/59]
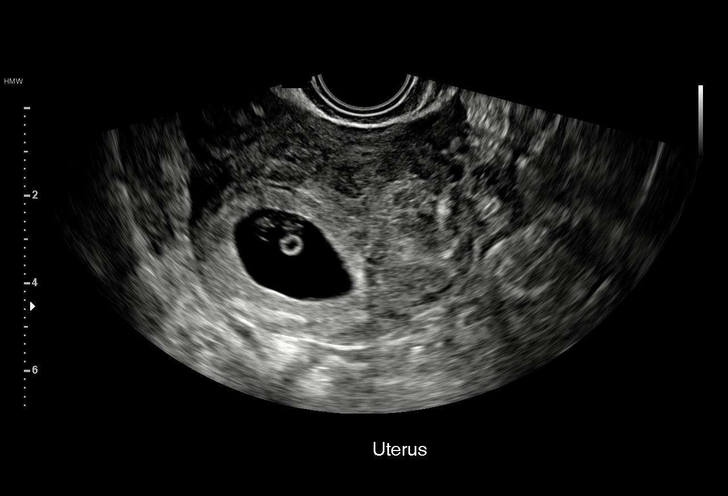
[im 41/59]
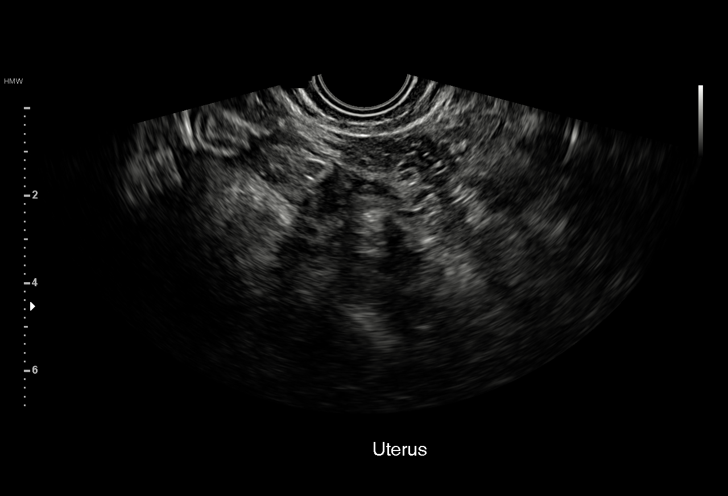
[im 46/59]
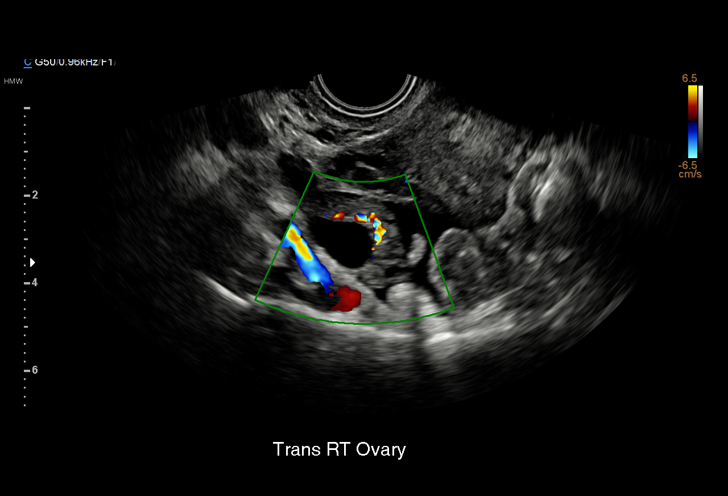
[im 50/59]
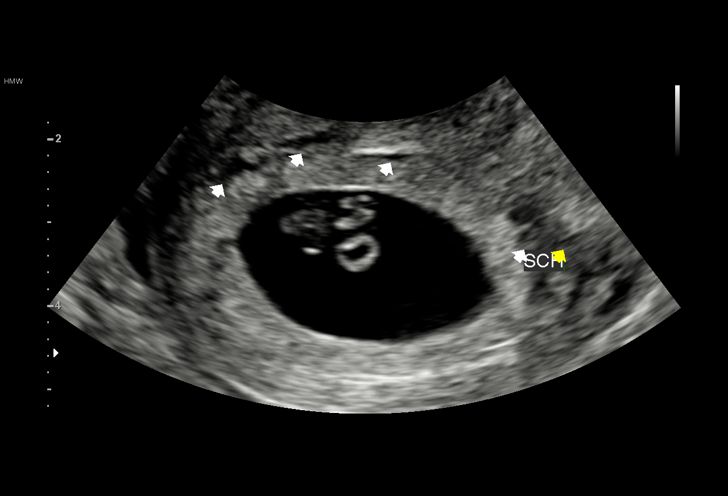
[im 54/59]
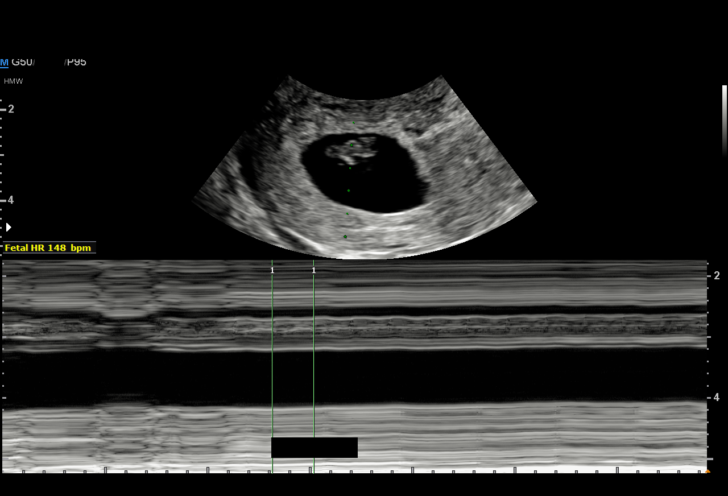
[im 59/59]
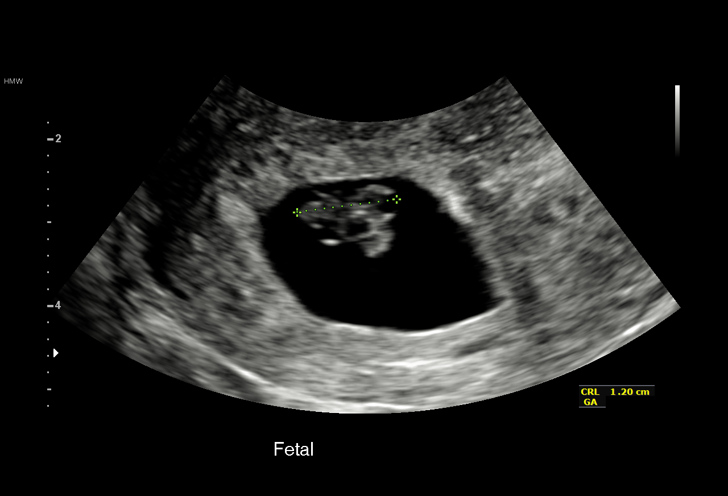

[15 of 28 positions shown; findings below may reference images not displayed]

FINDINGS: Intrauterine gestational sac: Single intrauterine gestational sac.

Yolk sac:  Present

Embryo:  Present

Cardiac Activity: Detected

Heart Rate: 148 bpm

CRL:   12  mm   7 w 3 d                  US EDC: 09/12/2016

Subchorionic hemorrhage:  Small subchorionic hemorrhage

Maternal uterus/adnexae: The right ovary appears unremarkable. The
left ovary is not visualized.

There is trace free fluid within the pelvis.
IMPRESSION: Single live intrauterine pregnancy with an estimated gestational age
of 7 weeks, 3 days. Trace subchorionic hemorrhage. Follow-up
recommended.

## 2018-05-08 ENCOUNTER — Ambulatory Visit: Payer: Self-pay | Attending: Internal Medicine | Admitting: Internal Medicine

## 2018-05-08 ENCOUNTER — Encounter: Payer: Self-pay | Admitting: Internal Medicine

## 2018-05-08 VITALS — BP 121/86 | HR 81 | Temp 99.1°F | Resp 16 | Ht 67.0 in | Wt 214.0 lb

## 2018-05-08 DIAGNOSIS — G8929 Other chronic pain: Secondary | ICD-10-CM

## 2018-05-08 DIAGNOSIS — Z87891 Personal history of nicotine dependence: Secondary | ICD-10-CM | POA: Insufficient documentation

## 2018-05-08 DIAGNOSIS — E669 Obesity, unspecified: Secondary | ICD-10-CM

## 2018-05-08 DIAGNOSIS — M25561 Pain in right knee: Secondary | ICD-10-CM | POA: Insufficient documentation

## 2018-05-08 DIAGNOSIS — Z7689 Persons encountering health services in other specified circumstances: Secondary | ICD-10-CM | POA: Insufficient documentation

## 2018-05-08 DIAGNOSIS — R030 Elevated blood-pressure reading, without diagnosis of hypertension: Secondary | ICD-10-CM

## 2018-05-08 DIAGNOSIS — Z6833 Body mass index (BMI) 33.0-33.9, adult: Secondary | ICD-10-CM | POA: Insufficient documentation

## 2018-05-08 DIAGNOSIS — Z23 Encounter for immunization: Secondary | ICD-10-CM

## 2018-05-08 DIAGNOSIS — M549 Dorsalgia, unspecified: Secondary | ICD-10-CM | POA: Insufficient documentation

## 2018-05-08 DIAGNOSIS — M25562 Pain in left knee: Secondary | ICD-10-CM | POA: Insufficient documentation

## 2018-05-08 DIAGNOSIS — Z79899 Other long term (current) drug therapy: Secondary | ICD-10-CM | POA: Insufficient documentation

## 2018-05-08 DIAGNOSIS — Z0000 Encounter for general adult medical examination without abnormal findings: Secondary | ICD-10-CM

## 2018-05-08 MED ORDER — TETANUS-DIPHTH-ACELL PERTUSSIS 5-2.5-18.5 LF-MCG/0.5 IM SUSP: 0.5000 mL | INTRAMUSCULAR | 0 refills | Status: DC

## 2018-05-08 NOTE — Patient Instructions (Addendum)
Follow a Healthy Eating Plan - You can do it! Limit sugary drinks.  Avoid sodas, sweet tea, sport or energy drinks, or fruit drinks.  Drink water, lo-fat milk, or diet drinks. Limit snack foods.   Cut back on candy, cake, cookies, chips, ice cream.  These are a special treat, only in small amounts. Eat plenty of vegetables.  Especially dark green, red, and orange vegetables. Aim for at least 3 servings a day. More is better! Include fruit in your daily diet.  Whole fruit is much healthier than fruit juice! Limit "white" bread, "white" pasta, "white" rice.   Choose "100% whole grain" products, brown or wild rice. Avoid fatty meats. Try "Meatless Monday" and choose eggs or beans one day a week.  When eating meat, choose lean meats like chicken, [place], and fish.  Grill, broil, or bake meats instead of frying, and eat poultry without the skin. Eat less salt.  Avoid frozen pizzas, frozen dinners and salty foods.  Use seasonings other than salt in cooking.  This can help blood pressure and keep you from swelling Beer, wine and liquor have calories.  If you can safely drink alcohol, limit to 1 drink per day for women, 2 drinks for men  Try to get in about 150 minutes a week of some form of regular aerobic exercise.  Td Vaccine (Tetanus and Diphtheria): What You Need to Know 1. Why get vaccinated? Tetanus  and diphtheria are very serious diseases. They are rare in the [place] today, but people who do become infected often have severe complications. Td vaccine is used to protect adolescents and adults from both of these diseases. Both tetanus and diphtheria are infections caused by bacteria. Diphtheria spreads from person to person through coughing or sneezing. Tetanus-causing bacteria enter the body through cuts, scratches, or wounds. TETANUS (lockjaw) causes painful muscle tightening and stiffness, usually all over the body.  It can lead to tightening of muscles in the head and neck so you can't  open your mouth, swallow, or sometimes even breathe. Tetanus kills about 1 out of every 10 people who are infected even after receiving the best medical care.  DIPHTHERIA can cause a thick coating to form in the back of the throat.  It can lead to breathing problems, paralysis, heart failure, and death.  Before vaccines, as many as 200,000 cases of diphtheria and hundreds of cases of tetanus were reported in the [place] each year. Since vaccination began, reports of cases for both diseases have dropped by about 99%. 2. Td vaccine Td vaccine can protect adolescents and adults from tetanus and diphtheria. Td is usually given as a booster dose every 10 years but it can also be given earlier after a severe and dirty wound or burn. Another vaccine, called Tdap, which protects against pertussis in addition to tetanus and diphtheria, is sometimes recommended instead of Td vaccine. Your doctor or the person giving you the vaccine can give you more information. Td may safely be given at the same time as other vaccines. 3. Some people should not get this vaccine  A person who has ever had a life-threatening allergic reaction after a previous dose of any tetanus or diphtheria containing vaccine, OR has a severe allergy to any part of this vaccine, should not get Td vaccine. Tell the person giving the vaccine about any severe allergies.  Talk to your doctor if you: ? had severe pain or swelling after any vaccine containing diphtheria or tetanus, ? ever had  a condition called Guillain Barre Syndrome (GBS), ? aren't feeling well on the day the shot is scheduled. 4. What are the risks from Td vaccine? With any medicine, including vaccines, there is a chance of side effects. These are usually mild and go away on their own. Serious reactions are also possible but are rare. Most people who get Td vaccine do not have any problems with it. Mild problems following Td vaccine: (Did not interfere with  activities)  Pain where the shot was given (about 8 people in 10)  Redness or swelling where the shot was given (about 1 person in 4)  Mild fever (rare)  Headache (about 1 person in 4)  Tiredness (about 1 person in 4)  Moderate problems following Td vaccine: (Interfered with activities, but did not require medical attention)  Fever over 102F (rare)  Severe problems following Td vaccine: (Unable to perform usual activities; required medical attention)  Swelling, severe pain, bleeding and/or redness in the arm where the shot was given (rare).  Problems that could happen after any vaccine:  People sometimes faint after a medical procedure, including vaccination. Sitting or lying down for about 15 minutes can help prevent fainting, and injuries caused by a fall. Tell your doctor if you feel dizzy, or have vision changes or ringing in the ears.  Some people get severe pain in the shoulder and have difficulty moving the arm where a shot was given. This happens very rarely.  Any medication can cause a severe allergic reaction. Such reactions from a vaccine are very rare, estimated at fewer than 1 in a million doses, and would happen within a few minutes to a few hours after the vaccination. As with any medicine, there is a very remote chance of a vaccine causing a serious injury or death. The safety of vaccines is always being monitored. For more information, visit: http://www.aguilar.org/ 5. What if there is a serious reaction? What should I look for? Look for anything that concerns you, such as signs of a severe allergic reaction, very high fever, or unusual behavior. Signs of a severe allergic reaction can include hives, swelling of the face and throat, difficulty breathing, a fast heartbeat, dizziness, and weakness. These would usually start a few minutes to a few hours after the vaccination. What should I do?  If you think it is a severe allergic reaction or other emergency  that can't wait, call 9-1-1 or get the person to the nearest hospital. Otherwise, call your doctor.  Afterward, the reaction should be reported to the Vaccine Adverse Event Reporting System (VAERS). Your doctor might file this report, or you can do it yourself through the VAERS web site at www.vaers.SamedayNews.es, or by calling 364-618-4426. ? VAERS does not give medical advice. 6. The National Vaccine Injury Compensation Program The [employer] Vaccine Injury Compensation Program (VICP) is a federal program that was created to compensate people who may have been injured by certain vaccines. Persons who believe they may have been injured by a vaccine can learn about the program and about filing a claim by calling (762) 736-9212 or visiting the [facility] website at GoldCloset.com.ee. There is a time limit to file a claim for compensation. 7. How can I learn more?  Ask your doctor. He or she can give you the vaccine package insert or suggest other sources of information.  Call your local or state health department.  Contact the Centers for Disease Control and Prevention (CDC): ? Call 586-604-9844 (1-800-CDC-INFO) ? Visit CDC's website at http://hunter.com/  CDC Td Vaccine VIS (09/01/15) This information is not intended to replace advice given to you by your health care provider. Make sure you discuss any questions you have with your health care provider. Document Released: 03/06/2006 Document Revised: 01/28/2016 Document Reviewed: 01/28/2016 Elsevier Interactive Patient Education  2017 Else[employer].  Influenza Virus Vaccine injection (Fluarix) What is this medicine? INFLUENZA VIRUS VACCINE (in floo EN zuh VAHY ruhs vak SEEN) helps to reduce the risk of getting influenza also known as the flu. This medicine may be used for other purposes; ask your health care provider or pharmacist if you have questions. COMMON BRAND NAME(S): Fluarix, Fluzone What should I tell my health care provider  before I take this medicine? They need to know if you have any of these conditions: -bleeding disorder like hemophilia -fever or infection -Guillain-Barre syndrome or other neurological problems -immune system problems -infection with the human immunodeficiency virus (HIV) or AIDS -low blood platelet counts -multiple sclerosis -an unusual or allergic reaction to influenza virus vaccine, eggs, chicken proteins, latex, gentamicin, other medicines, foods, dyes or preservatives -pregnant or trying to get pregnant -breast-feeding How should I use this medicine? This vaccine is for injection into a muscle. It is given by a health care professional. A copy of Vaccine Information Statements will be given before each vaccination. Read this sheet carefully each time. The sheet may change frequently. Talk to your pediatrician regarding the use of this medicine in children. Special care may be needed. Overdosage: If you think you have taken too much of this medicine contact a poison control center or emergency room at once. NOTE: This medicine is only for you. Do not share this medicine with others. What if I miss a dose? This does not apply. What may interact with this medicine? -chemotherapy or radiation therapy -medicines that lower your immune system like etanercept, anakinra, infliximab, and adalimumab -medicines that treat or prevent blood clots like warfarin -phenytoin -steroid medicines like prednisone or cortisone -theophylline -vaccines This list may not describe all possible interactions. Give your health care provider a list of all the medicines, herbs, non-prescription drugs, or dietary supplements you use. Also tell them if you smoke, drink alcohol, or use illegal drugs. Some items may interact with your medicine. What should I watch for while using this medicine? Report any side effects that do not go away within 3 days to your doctor or health care professional. Call your health  care provider if any unusual symptoms occur within 6 weeks of receiving this vaccine. You may still catch the flu, but the illness is not usually as bad. You cannot get the flu from the vaccine. The vaccine will not protect against colds or other illnesses that may cause fever. The vaccine is needed every year. What side effects may I notice from receiving this medicine? Side effects that you should report to your doctor or health care professional as soon as possible: -allergic reactions like skin rash, itching or hives, swelling of the face, lips, or tongue Side effects that usually do not require medical attention (report to your doctor or health care professional if they continue or are bothersome): -fever -headache -muscle aches and pains -pain, tenderness, redness, or swelling at site where injected -weak or tired This list may not describe all possible side effects. Call your doctor for medical advice about side effects. You may report side effects to FDA at 1-800-FDA-1088. Where should I keep my medicine? This vaccine is only given in a clinic, pharmacy, doctor's  office, or other health care setting and will not be stored at home. NOTE: This sheet is a summary. It may not cover all possible information. If you have questions about this medicine, talk to your doctor, pharmacist, or health care provider.  2018 Elsevier/Gold Standard (2007-12-05 09:30:40)

## 2018-05-08 NOTE — Progress Notes (Signed)
Patient ID: Tammy Ray, female    DOB: 1981/03/08  MRN: [id number]  CC: re-stablish   Subjective: Tammy Ray is a 37 y.o. female who presents to re-est care.  Last seen in 2017. Her concerns today include:   Pt does not have any chronic medical issues and is not on any chronic meds. Here for general check up but does want to have pap today. Last pap in 2016.  Fhx, surgical and social history reviewed.  Patient Active Problem List   Diagnosis Date Noted  . 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 Ascorbic Acid (VITA-C PO) Take 1 tablet by mouth daily.    . Cholecalciferol (VITAMIN D PO) Take 1 tablet by mouth daily.    . Cyanocobalamin (B-12 PO) Take 1 tablet by mouth daily.     No current facility-administered medications on file prior to visit.     Allergies  Allergen Reactions  . Oatmeal Rash    Social History   Socioeconomic History  . Marital status: Single    Spouse name: Not on file  . Number of children: 3  . Years of education: Not on file  . Highest education level: Associate degree: academic program  Occupational History  . Not on file  Social Needs  . Financial resource strain: Not on file  . Food insecurity:    Worry: Not on file    Inability: Not on file  . Transportation needs:    Medical: Not on file    Non-medical: Not on file  Tobacco Use  . Smoking status: Former Smoker    Last attempt to quit: 11/19/2013    Years since quitting: 4.4  . Smokeless tobacco: Never Used  Substance and Sexual Activity  . Alcohol use: Yes    Comment: ocassional  . Drug use: No  . Sexual activity: Yes    Birth control/protection: Injection  Lifestyle  . Physical activity:    Days per week: Not on file    Minutes per session: Not on file  . Stress: Not on file  Relationships  . Social connections:    Talks on phone: Not on file    Gets together: Not on file    Attends  religious service: Not on file    Active member of club or organization: Not on file    Attends meetings of clubs or organizations: Not on file    Relationship status: Not on file  . Intimate partner violence:    Fear of current or ex partner: Not on file    Emotionally abused: Not on file    Physically abused: Not on file    Forced sexual activity: Not on file  Other Topics Concern  . Not on file  Social History Narrative  . Not on file    Family History  Problem Relation Age of Onset  . Breast cancer Maternal Aunt   . Breast cancer Maternal Grandmother     Past Surgical History:  Procedure Laterality Date  . CESAREAN SECTION    . LAPAROSCOPIC TUBAL LIGATION Bilateral 05/26/2016   Procedure: LAPAROSCOPIC TUBAL LIGATION;  Surgeon: Hermina Staggers, MD;  Location: [facility];  Service: Gynecology;  Laterality: Bilateral;    ROS: Review of Systems  Constitutional:       No getting in much exercise - once or twice a wk for 10 mins.  Eating 2-3 x a day.  Thinks  portion sizes okay. Drinks mainly water.  No sodas or sweet tea  HENT: Negative for congestion, ear pain and hearing loss.   Eyes: Negative for visual disturbance.  Respiratory: Positive for shortness of breath (sometimes when running up stairs). Negative for cough.   Cardiovascular: Positive for leg swelling (feet sometimes swell a little). Negative for chest pain and palpitations.  Gastrointestinal: Negative for abdominal pain and blood in stool.  Endocrine: Negative for cold intolerance and heat intolerance.  Genitourinary: Negative for difficulty urinating.  Musculoskeletal:       Aching in knees when "I stand up straight. I'm kinda bowleg." No pain in knees with sitting.  Some pain at times with walking.  No swelling.  Psychiatric/Behavioral: Negative for dysphoric mood. The patient is not nervous/anxious.     PHYSICAL EXAM: BP 121/86   Pulse 81   Temp 99.1 F (37.3 C) (Oral)   Resp 16   Ht 5\' 7"  (1.702 m)   Wt 214  lb (97.1 kg)   SpO2 97%   BMI 33.52 kg/m   BP 128/80 Physical Exam  General appearance - alert, well appearing, young to middle age AAF and in no distress Mental status - normal mood, behavior, speech, dress, motor activity, and thought processes Eyes - pupils equal and reactive, extraocular eye movements intact.  Pink conjunctiva Ears - bilateral TM's and external ear canals normal Nose - normal and patent, no erythema, discharge or polyps Mouth - mucous membranes moist, pharynx normal without lesions Neck - supple, no significant adenopathy Lymphatics -no cervical or axillary LN Chest - clear to auscultation, no wheezes, rales or rhonchi, symmetric air entry Heart - normal rate, regular rhythm, normal S1, S2, no murmurs, rubs, clicks or gallops Abdomen - soft, nontender, nondistended, no masses or organomegaly Breasts - deferred until pt returns for PAP Pelvic -deferred. Pt will schedule pap Musculoskeletal - knees: Mild varus deformity.  No joint enlargement.  No point tenderness.  Good passive range of motion. Extremities -no lower extremity edema.  Good radial and brachial pulses.  Feet are warm.  ASSESSMENT AND PLAN: 1. General medical exam  2. Elevated blood pressure reading Normal blood pressure 120/80 or lower.  She has mild elevation in systolic blood pressure.  DASH diet discussed and encouraged.  Also discussed the importance of regular exercise.  3. Obesity (BMI 30-39.9) I went over with her the meaning of BMI.  Counseling given for healthy eating habits.  It turns out that she likes to white carbohydrates.  I recommend changing from white to whole-grain wheat bread, and cutting back on rice, pasta and potatoes.  Encouraged to eat more white meat than red meat and to limit sugary drinks.  Advised at least 150 minutes of some form of regular aerobic exercise per week -baseline blood test including CBC, Chem, lipid and A1C ordered  4. Chronic pain of both knees Exam does  not suggest any underlying arthritis at this time.  However weight loss encouraged to help prevent it.  5. Need for Tdap vaccination   6. Need for immunization against influenza - Flu Vaccine QUAD 36+ mos IM  Patient to follow-up in 6 to 7 weeks for Pap and breast exam.  Patient was given the opportunity to ask questions.  Patient verbalized understanding of the plan and was able to repeat key elements of the plan.   Orders Placed This Encounter  Procedures  . Flu Vaccine QUAD 36+ mos IM  . CBC  . Lipid panel  .  Comprehensive metabolic panel  . Hemoglobin A1c     Requested Prescriptions   Signed Prescriptions Disp Refills  . Tdap (BOOSTRIX) 5-2.5-18.5 LF-MCG/0.5 injection 0.5 mL 0    Sig: Inject 0.5 mLs into the muscle as directed.    Return in about 7 weeks (around 06/26/2018) for pap.  Jonah Blue, MD, FACP

## 2018-06-28 ENCOUNTER — Encounter: Payer: Self-pay | Admitting: Internal Medicine

## 2018-06-28 ENCOUNTER — Ambulatory Visit: Payer: Self-pay | Attending: Internal Medicine | Admitting: Internal Medicine

## 2018-06-28 VITALS — BP 126/76 | HR 78 | Temp 98.3°F | Resp 16 | Wt 212.2 lb

## 2018-06-28 DIAGNOSIS — E669 Obesity, unspecified: Secondary | ICD-10-CM | POA: Insufficient documentation

## 2018-06-28 DIAGNOSIS — R030 Elevated blood-pressure reading, without diagnosis of hypertension: Secondary | ICD-10-CM

## 2018-06-28 DIAGNOSIS — Z124 Encounter for screening for malignant neoplasm of cervix: Secondary | ICD-10-CM

## 2018-06-28 DIAGNOSIS — Z803 Family history of malignant neoplasm of breast: Secondary | ICD-10-CM

## 2018-06-28 DIAGNOSIS — Z91018 Allergy to other foods: Secondary | ICD-10-CM | POA: Insufficient documentation

## 2018-06-28 DIAGNOSIS — Z6833 Body mass index (BMI) 33.0-33.9, adult: Secondary | ICD-10-CM | POA: Insufficient documentation

## 2018-06-28 DIAGNOSIS — Z87891 Personal history of nicotine dependence: Secondary | ICD-10-CM | POA: Insufficient documentation

## 2018-06-28 DIAGNOSIS — N760 Acute vaginitis: Secondary | ICD-10-CM

## 2018-06-28 DIAGNOSIS — Z79899 Other long term (current) drug therapy: Secondary | ICD-10-CM | POA: Insufficient documentation

## 2018-06-28 MED ORDER — FLUCONAZOLE 150 MG PO TABS: 150.0000 mg | ORAL_TABLET | Freq: Once | ORAL | 0 refills | Status: AC

## 2018-06-28 NOTE — Patient Instructions (Addendum)
I have sent a prescription for Diflucan which is a medication for vaginal yeast infection to your pharmacy.  Continue trying to exercise regularly.  The goal is to get in about 150 minutes/week of aerobic exercise.  Continue to limit salt in the foods.

## 2018-06-28 NOTE — Progress Notes (Signed)
Patient ID: Tammy Ray, female    DOB: September 17, 1980  MRN: [id number]  CC: Gynecologic Exam   Subjective: Tammy Ray is a 38 y.o. female who presents for PAP Her concerns today include:  Patient with history of obesity  Pt is G5P3 (2 abortions) Last sexually active 2.5 yrs ago Pt had tubal ligation No abnormal paps in past No vaginal dischg, little itching Menses regular lasting 3-4 days.  Heavy first 2 days.  Fhx of breast CA in maternal GM and aunt.  Elev BP:  Trying to limit salt in foods.  No device to check BP.  Started dancing 2 x a wk for exercise.  Patient Active Problem List   Diagnosis Date Noted  . Obesity (BMI 30-39.9) 05/08/2018  . 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 Ascorbic Acid (VITA-C PO) Take 1 tablet by mouth daily.    . Cholecalciferol (VITAMIN D PO) Take 1 tablet by mouth daily.    . Cyanocobalamin (B-12 PO) Take 1 tablet by mouth daily.    . Tdap (BOOSTRIX) 5-2.5-18.5 LF-MCG/0.5 injection Inject 0.5 mLs into the muscle as directed. 0.5 mL 0   No current facility-administered medications on file prior to visit.     Allergies  Allergen Reactions  . Oatmeal Rash    Social History   Socioeconomic History  . Marital status: Single    Spouse name: Not on file  . Number of children: 3  . Years of education: Not on file  . Highest education level: Associate degree: academic program  Occupational History  . Not on file  Social Needs  . Financial resource strain: Not on file  . Food insecurity:    Worry: Not on file    Inability: Not on file  . Transportation needs:    Medical: Not on file    Non-medical: Not on file  Tobacco Use  . Smoking status: Former Smoker    Last attempt to quit: 11/19/2013    Years since quitting: 4.6  . Smokeless tobacco: Never Used  Substance and Sexual Activity  . Alcohol use: Yes    Comment: ocassional  . Drug use: No  .  Sexual activity: Yes    Birth control/protection: Injection  Lifestyle  . Physical activity:    Days per week: Not on file    Minutes per session: Not on file  . Stress: Not on file  Relationships  . Social connections:    Talks on phone: Not on file    Gets together: Not on file    Attends religious service: Not on file    Active member of club or organization: Not on file    Attends meetings of clubs or organizations: Not on file    Relationship status: Not on file  . Intimate partner violence:    Fear of current or ex partner: Not on file    Emotionally abused: Not on file    Physically abused: Not on file    Forced sexual activity: Not on file  Other Topics Concern  . Not on file  Social History Narrative  . Not on file    Family History  Problem Relation Age of Onset  . Breast cancer Maternal Aunt   . Breast cancer Maternal Grandmother     Past Surgical History:  Procedure Laterality Date  . CESAREAN SECTION    . LAPAROSCOPIC TUBAL LIGATION Bilateral 05/26/2016   Procedure:  LAPAROSCOPIC TUBAL LIGATION;  Surgeon: Chancy Milroy, MD;  Location: [facility];  Service: Gynecology;  Laterality: Bilateral;    ROS: Review of Systems Neg except as above PHYSICAL EXAM: BP 129/83   Pulse 78   Temp 98.3 F (36.8 C) (Oral)   Resp 16   Wt 212 lb 3.2 oz (96.3 kg)   LMP 06/12/2018   SpO2 96%   BMI 33.24 kg/m   Wt Readings from Last 3 Encounters:  06/28/18 212 lb 3.2 oz (96.3 kg)  05/08/18 214 lb (97.1 kg)  06/24/16 166 lb (75.3 kg)  BP 126/76  Physical Exam  General appearance - alert, well appearing, and in no distress Mental status - normal mood, behavior, speech, dress, motor activity, and thought processes Breasts -[facility]MA Pollock present for breast and pelvic exams: breasts appear normal, no suspicious masses, no skin or nipple changes or axillary nodes Pelvic - normal external genitalia, vulva, vagina, cervix, uterus and adnexa.  Moderate thick white discgh in  vaginal vault around cervix   ASSESSMENT AND PLAN: 1. Pap smear for cervical cancer screening - Cytology - PAP  2. Family history of breast cancer Genetic testing recommended if 3 or more first or second-degree relative have breast CA.  She does not meet that criteria.  However, I forgot to ask if her GM or aunt got testes for BRCA 1/2 gene We discussed breast cancer screening with mammography.  Latest recommendation is to start at age 64.  However I do allow patient preference if they want to start at age 77.  Patient declines starting at age 29.   3. Acute vaginitis - fluconazole (DIFLUCAN) 150 MG tablet; Take 1 tablet (150 mg total) by mouth once for 1 dose.  Dispense: 1 tablet; Refill: 0  4. Elevated blood pressure reading Blood pressure improved.  Encouraged her to continue a low-salt diet.  Advised to try to increase exercise to at least 3 times a week  Patient was given the opportunity to ask questions.  Patient verbalized understanding of the plan and was able to repeat key elements of the plan.   No orders of the defined types were placed in this encounter.    Requested Prescriptions   Signed Prescriptions Disp Refills  . fluconazole (DIFLUCAN) 150 MG tablet 1 tablet 0    Sig: Take 1 tablet (150 mg total) by mouth once for 1 dose.    No follow-ups on file.  Karle Plumber, MD, FACP

## 2018-07-02 LAB — CYTOLOGY - PAP
Adequacy: ABSENT
Bacterial vaginitis: NEGATIVE
CHLAMYDIA, DNA PROBE: NEGATIVE
Candida vaginitis: NEGATIVE
Diagnosis: NEGATIVE
HPV: NOT DETECTED
Neisseria Gonorrhea: NEGATIVE
Trichomonas: NEGATIVE

## 2020-03-23 DIAGNOSIS — Z419 Encounter for procedure for purposes other than remedying health state, unspecified: Secondary | ICD-10-CM | POA: Diagnosis not present

## 2020-04-22 DIAGNOSIS — Z419 Encounter for procedure for purposes other than remedying health state, unspecified: Secondary | ICD-10-CM | POA: Diagnosis not present

## 2020-05-23 DIAGNOSIS — Z419 Encounter for procedure for purposes other than remedying health state, unspecified: Secondary | ICD-10-CM | POA: Diagnosis not present

## 2020-06-23 DIAGNOSIS — Z419 Encounter for procedure for purposes other than remedying health state, unspecified: Secondary | ICD-10-CM | POA: Diagnosis not present

## 2020-07-21 DIAGNOSIS — Z419 Encounter for procedure for purposes other than remedying health state, unspecified: Secondary | ICD-10-CM | POA: Diagnosis not present

## 2020-08-21 DIAGNOSIS — Z419 Encounter for procedure for purposes other than remedying health state, unspecified: Secondary | ICD-10-CM | POA: Diagnosis not present

## 2020-09-20 DIAGNOSIS — Z419 Encounter for procedure for purposes other than remedying health state, unspecified: Secondary | ICD-10-CM | POA: Diagnosis not present

## 2020-10-21 DIAGNOSIS — Z419 Encounter for procedure for purposes other than remedying health state, unspecified: Secondary | ICD-10-CM | POA: Diagnosis not present

## 2020-11-20 DIAGNOSIS — Z419 Encounter for procedure for purposes other than remedying health state, unspecified: Secondary | ICD-10-CM | POA: Diagnosis not present

## 2020-12-21 DIAGNOSIS — Z419 Encounter for procedure for purposes other than remedying health state, unspecified: Secondary | ICD-10-CM | POA: Diagnosis not present

## 2021-01-21 DIAGNOSIS — Z419 Encounter for procedure for purposes other than remedying health state, unspecified: Secondary | ICD-10-CM | POA: Diagnosis not present

## 2021-02-20 DIAGNOSIS — Z419 Encounter for procedure for purposes other than remedying health state, unspecified: Secondary | ICD-10-CM | POA: Diagnosis not present

## 2021-03-23 DIAGNOSIS — Z419 Encounter for procedure for purposes other than remedying health state, unspecified: Secondary | ICD-10-CM | POA: Diagnosis not present

## 2021-04-22 DIAGNOSIS — Z419 Encounter for procedure for purposes other than remedying health state, unspecified: Secondary | ICD-10-CM | POA: Diagnosis not present

## 2021-05-23 DIAGNOSIS — Z419 Encounter for procedure for purposes other than remedying health state, unspecified: Secondary | ICD-10-CM | POA: Diagnosis not present

## 2021-06-23 DIAGNOSIS — Z419 Encounter for procedure for purposes other than remedying health state, unspecified: Secondary | ICD-10-CM | POA: Diagnosis not present

## 2021-07-21 DIAGNOSIS — Z419 Encounter for procedure for purposes other than remedying health state, unspecified: Secondary | ICD-10-CM | POA: Diagnosis not present

## 2021-08-21 DIAGNOSIS — Z419 Encounter for procedure for purposes other than remedying health state, unspecified: Secondary | ICD-10-CM | POA: Diagnosis not present

## 2021-09-20 DIAGNOSIS — Z419 Encounter for procedure for purposes other than remedying health state, unspecified: Secondary | ICD-10-CM | POA: Diagnosis not present

## 2021-10-21 DIAGNOSIS — Z419 Encounter for procedure for purposes other than remedying health state, unspecified: Secondary | ICD-10-CM | POA: Diagnosis not present

## 2021-11-20 DIAGNOSIS — Z419 Encounter for procedure for purposes other than remedying health state, unspecified: Secondary | ICD-10-CM | POA: Diagnosis not present

## 2021-12-21 DIAGNOSIS — Z419 Encounter for procedure for purposes other than remedying health state, unspecified: Secondary | ICD-10-CM | POA: Diagnosis not present

## 2021-12-23 ENCOUNTER — Telehealth: Payer: Self-pay | Admitting: Emergency Medicine

## 2021-12-23 NOTE — Telephone Encounter (Signed)
Attempted to contact patient to get her scheduled for an appointment with her PCP, patient did not answer and VM unable to be left.

## 2022-01-21 DIAGNOSIS — Z419 Encounter for procedure for purposes other than remedying health state, unspecified: Secondary | ICD-10-CM | POA: Diagnosis not present

## 2022-02-20 DIAGNOSIS — Z419 Encounter for procedure for purposes other than remedying health state, unspecified: Secondary | ICD-10-CM | POA: Diagnosis not present

## 2022-03-23 DIAGNOSIS — Z419 Encounter for procedure for purposes other than remedying health state, unspecified: Secondary | ICD-10-CM | POA: Diagnosis not present

## 2022-04-22 DIAGNOSIS — Z419 Encounter for procedure for purposes other than remedying health state, unspecified: Secondary | ICD-10-CM | POA: Diagnosis not present

## 2022-05-23 DIAGNOSIS — Z419 Encounter for procedure for purposes other than remedying health state, unspecified: Secondary | ICD-10-CM | POA: Diagnosis not present

## 2022-06-23 DIAGNOSIS — Z419 Encounter for procedure for purposes other than remedying health state, unspecified: Secondary | ICD-10-CM | POA: Diagnosis not present

## 2022-07-13 ENCOUNTER — Ambulatory Visit
Admission: EM | Admit: 2022-07-13 | Discharge: 2022-07-13 | Disposition: A | Discharge status: Home / Self Care | Payer: Medicaid Other | Attending: Urgent Care | Admitting: Urgent Care

## 2022-07-13 ENCOUNTER — Encounter: Payer: Self-pay | Admitting: Emergency Medicine

## 2022-07-13 DIAGNOSIS — R6889 Other general symptoms and signs: Secondary | ICD-10-CM | POA: Diagnosis not present

## 2022-07-13 DIAGNOSIS — R059 Cough, unspecified: Secondary | ICD-10-CM | POA: Insufficient documentation

## 2022-07-13 DIAGNOSIS — Z1152 Encounter for screening for COVID-19: Secondary | ICD-10-CM | POA: Insufficient documentation

## 2022-07-13 NOTE — ED Notes (Signed)
Gave patient information/ resources about behavior health treatment that is available to her 24/7 if she needed them.

## 2022-07-13 NOTE — ED Triage Notes (Addendum)
Productive cough, fatigue, generalized weakness, mental fog, nausea, vomiting, and throat irritation since this past Friday. Denies ear pain, diarrhea Taking nyquil, garlic pills, vitamin C to try and help  Patient also reports she does have thoughts of wanting to hurt self occasionally, but has not plan or intent to act. Does not currently have PCP at this time but will assist with getting her one prior to leaving today to assist with care moving forward.

## 2022-07-13 NOTE — Discharge Instructions (Addendum)
You have been diagnosed with a viral upper respiratory infection based on your symptoms and exam. Viral illnesses cannot be treated with antibiotics - they are self limiting - and you should find your symptoms resolving within a few days. Get plenty of rest and non-caffeinated fluids. Watch for signs of dehydration including reduced urine output and dark colored urine.  We have performed a respiratory swab testing for COVID.  If the results of this testing are positive, someone will call you if you are eligible for any antiviral treatment.    We recommend you use over-the-counter medications for symptom control including acetaminophen (Tylenol), ibuprofen (Advil/Motrin) or naproxen (Aleve) for throat pain, fever, chills or body aches. You may combine use of acetaminophen and ibuprofen/naproxen if needed.  Some patients find an pain-relieving throat spray such as Chloraseptic to be effective.  Also recommend cold/cough medication containing a cough suppressant such as dextromethorphan, as needed.   Saline mist spray is helpful for removing excess mucus from your nose.  Room humidifiers are helpful to ease breathing at night. I recommend guaifenesin (Mucinex) with plenty of water throughout the day to help thin and loosen mucus secretions in your respiratory passages.   If appropriate based upon your other medical problems, you might also find relief of nasal/sinus congestion symptoms by using a nasal decongestant such as fluticasone (Flonase ) or pseudoephedrine (Sudafed sinus).  You will need to obtain Sudafed from behind the pharmacist counter.  Speak to the pharmacist to verify that you are not duplicating medications with other over-the-counter formulations that you may be using.

## 2022-07-13 NOTE — ED Provider Notes (Signed)
[facility] Pal[facility]    CSN: [id number] Arrival date & time: 07/13/22  1154      History   Chief Complaint Chief Complaint  Patient presents with   Cough    HPI Tammy Ray is a 42 y.o. female.    Cough   Patient presents to urgent care with complaint of symptoms for 5 days.  Endorses productive cough, fatigue, generalized weakness, "mental fog", nausea, vomiting, throat irritation.  Denies ear pain, diarrhea.  Patient endorses thoughts of self-harm occasionally but denies plan or intent to act.  No current PCP.  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 Authorizing Provider  Ascorbic Acid (VITA-C PO) Take 1 tablet by mouth daily.    [provider]  Cholecalciferol (VITAMIN D PO) Take 1 tablet by mouth daily.    [provider]  Cyanocobalamin (B-12 PO) Take 1 tablet by mouth daily.    [provider]  Tdap (BOOSTRIX) 5-2.5-18.5 LF-MCG/0.5 injection Inject 0.5 mLs into the muscle as directed. 05/08/18   Ladell Pier, MD    Family History Family History  Problem Relation Age of Onset   Breast cancer Maternal Grandmother    Breast cancer Maternal Aunt     Social  History Social History   Tobacco Use   Smoking status: Former    Types: Cigarettes    Quit date: 11/19/2013    Years since quitting: 8.6   Smokeless tobacco: Never  Vaping Use   Vaping Use: Never used  Substance Use Topics   Alcohol use: Yes    Comment: ocassional   Drug use: No     Allergies   Oatmeal   Review of Systems Review of Systems  Respiratory:  Positive for cough.      Physical Exam Triage Vital Signs ED Triage Vitals  Enc Vitals Group     BP 07/13/22 1212 134/87     Pulse Rate 07/13/22 1212 91     Resp 07/13/22 1212 16     Temp 07/13/22 1212 98.8 F (37.1 C)     Temp Source 07/13/22 1212 Oral     SpO2 07/13/22 1212 98 %     Weight --      Height --      Head Circumference --      Peak Flow --      Pain Score 07/13/22 1225 8  Pain Loc --      Pain Edu? --      Excl. in [place]? --    No data found.  Updated Vital Signs BP 134/87 (BP Location: Left Arm)   Pulse 91   Temp 98.8 F (37.1 C) (Oral)   Resp 16   LMP 07/12/2022   SpO2 98%   Visual Acuity Right Eye Distance:   Left Eye Distance:   Bilateral Distance:    Right Eye Near:   Left Eye Near:    Bilateral Near:     Physical Exam Vitals reviewed.  Constitutional:      Appearance: Normal appearance. She is ill-appearing.  Cardiovascular:     Rate and Rhythm: Normal rate and regular rhythm.     Pulses: Normal pulses.     Heart sounds: Normal heart sounds.  Pulmonary:     Effort: Pulmonary effort is normal.     Breath sounds: Normal breath sounds.  Skin:    General: Skin is warm and dry.  Neurological:     General: No focal deficit present.     Mental Status: She is alert and oriented to person, place, and time.  Psychiatric:        Mood and Affect: Mood normal.        Behavior: Behavior normal.      UC Treatments / Results  Labs (all labs ordered are listed, but only abnormal results are displayed) Labs Reviewed - No data to display  EKG   Radiology No results  found.  Procedures Procedures (including critical care time)  Medications Ordered in UC Medications - No data to display  Initial Impression / Assessment and Plan / UC Course  I have reviewed the triage vital signs and the nursing notes.  Pertinent labs & imaging results that were available during my care of the patient were reviewed by me and considered in my medical decision making (see chart for details).   Patient is afebrile here without recent antipyretics. Satting well on room air. Overall is very ill appearing, well hydrated, without respiratory distress. Pulmonary exam is unremarkable.  Lungs CTAB without wheezing, rhonchi, rales.  Patient's symptoms are consistent with an acute viral process.  She is outside the window for antiviral therapy with Tamiflu.  Offered COVID swab though she is also outside the window for Paxlovid.  Recommending use of OTC medication for symptom control.  Cautioned patient to proceed to ED if her symptoms continue, or worsen.  Reminded her to push hydration,  Final Clinical Impressions(s) / UC Diagnoses   Final diagnoses:  None   Discharge Instructions   None    ED Prescriptions   None    PDMP not reviewed this encounter.   Rose Phi, [place] 07/13/22 1308

## 2022-07-14 LAB — SARS CORONAVIRUS 2 (TAT 6-24 HRS): SARS Coronavirus 2: NEGATIVE

## 2022-07-22 DIAGNOSIS — Z419 Encounter for procedure for purposes other than remedying health state, unspecified: Secondary | ICD-10-CM | POA: Diagnosis not present

## 2022-07-29 ENCOUNTER — Ambulatory Visit (INDEPENDENT_AMBULATORY_CARE_PROVIDER_SITE_OTHER): Payer: Medicaid Other | Admitting: Nurse Practitioner

## 2022-07-29 ENCOUNTER — Encounter: Payer: Self-pay | Admitting: Nurse Practitioner

## 2022-07-29 VITALS — BP 124/82 | HR 81 | Temp 98.0°F | Ht 67.0 in | Wt 194.4 lb

## 2022-07-29 DIAGNOSIS — F419 Anxiety disorder, unspecified: Secondary | ICD-10-CM | POA: Diagnosis not present

## 2022-07-29 DIAGNOSIS — R030 Elevated blood-pressure reading, without diagnosis of hypertension: Secondary | ICD-10-CM | POA: Diagnosis not present

## 2022-07-29 DIAGNOSIS — H1013 Acute atopic conjunctivitis, bilateral: Secondary | ICD-10-CM | POA: Diagnosis not present

## 2022-07-29 DIAGNOSIS — E669 Obesity, unspecified: Secondary | ICD-10-CM

## 2022-07-29 DIAGNOSIS — F32A Depression, unspecified: Secondary | ICD-10-CM

## 2022-07-29 MED ORDER — OLOPATADINE HCL 0.1 % OP SOLN: 1.0000 [drp] | Freq: Two times a day (BID) | OPHTHALMIC | 12 refills | Status: AC

## 2022-07-29 NOTE — Patient Instructions (Addendum)
Schedule fasting labs ordered. Olopatadine prescription sent to the pharmacy Routine care. Check BP once or twice and bring the readings to next visit.  Follow up in 2 weeks.

## 2022-07-29 NOTE — Progress Notes (Signed)
New Patient Office Visit  Subjective    Patient ID: Tammy Ray, female    DOB: 01-10-1981  Age: 42 y.o. MRN: [id number]  CC:  Chief Complaint  Patient presents with   Establish Care    White mucous in eyes     HPI Tammy Ray presents to establish care. Her previous PCP was Dr. Neoma Laming.  She has no chronic medical history and is not on any prescription medications at present other than supplements.   She reports eye discharge from last few days during the morning when she wakes up.     Denies chest pain, shortness of breath or headache at present.   Health Maintenance  Topic Date Due   Hepatitis C Screening  Never done   PAP SMEAR-Modifier  06/28/2021   COVID-19 Vaccine (1) 08/14/2022 (Originally [birth year]/1978)   INFLUENZA VACCINE  08/21/2022 (Originally 12/21/2021)   DTaP/Tdap/Td (2 - Td or Tdap) 05/08/2028   HIV Screening  Completed   HPV VACCINES  Aged Out    There are no preventive care reminders to display for this patient.  Outpatient Encounter Medications as of 07/29/2022  Medication Sig   Ascorbic Acid (VITA-C PO) Take 1 tablet by mouth daily.   Cholecalciferol (VITAMIN D PO) Take 1 tablet by mouth daily.   ferrous sulfate 325 (65 FE) MG EC tablet Take 325 mg by mouth 3 (three) times daily with meals.   lactobacillus acidophilus (BACID) TABS tablet Take 2 tablets by mouth 3 (three) times daily.   Multiple Vitamin (MULTIVITAMIN) tablet Take 1 tablet by mouth daily.   olopatadine (PATANOL) 0.1 % ophthalmic solution Place 1 drop into both eyes 2 (two) times daily.   [DISCONTINUED] Cyanocobalamin (B-12 PO) Take 1 tablet by mouth daily.   [DISCONTINUED] Tdap (BOOSTRIX) 5-2.5-18.5 LF-MCG/0.5 injection Inject 0.5 mLs into the muscle as directed.   No facility-administered encounter medications on file as of 07/29/2022.    Past Medical History:  Diagnosis Date   Anxiety    Bilateral leg cramps    Depression    Kidney infection    UTI (lower urinary tract  infection)     Past Surgical History:  Procedure Laterality Date   CESAREAN SECTION     LAPAROSCOPIC TUBAL LIGATION Bilateral 05/26/2016   Procedure: LAPAROSCOPIC TUBAL LIGATION;  Surgeon: Chancy Milroy, MD;  Location: [facility];  Service: Gynecology;  Laterality: Bilateral;    Family History  Problem Relation Age of Onset   Seizures Mother    Heart disease Father    Cancer Sister    Breast cancer Maternal Grandmother    Breast cancer Maternal Aunt     Social History   Socioeconomic History   Marital status: Single    Spouse name: Not on file   Number of children: 3   Years of education: Not on file   Highest education level: Associate degree: academic program  Occupational History   Not on file  Tobacco Use   Smoking status: Former    Types: Cigarettes    Quit date: 11/19/2013    Years since quitting: 8.7   Smokeless tobacco: Never  Vaping Use   Vaping Use: Never used  Substance and Sexual Activity   Alcohol use: Yes    Comment: ocassional   Drug use: No   Sexual activity: Not Currently  Other Topics Concern   Not on file  Social History Narrative   Not on file   Social Determinants of Health   Financial Resource Strain: Not on  file  Food Insecurity: Not on file  Transportation Needs: Not on file  Physical Activity: Not on file  Stress: Not on file  Social Connections: Not on file  Intimate Partner Violence: Not on file    Review of Systems  Constitutional: Negative.   HENT:  Negative for congestion and tinnitus.   Eyes:  Positive for discharge.  Respiratory:  Negative for cough and shortness of breath.   Cardiovascular:  Negative for chest pain and claudication.  Gastrointestinal: Negative.   Genitourinary: Negative.   Musculoskeletal: Negative.   Skin: Negative.   Neurological: Negative.   Psychiatric/Behavioral: Negative.          Objective    BP 124/82   Pulse 81   Temp 98 F (36.7 C)   Ht 5\' 7"  (1.702 m)   Wt 194 lb 6.4 oz (88.2 kg)    LMP 07/12/2022   SpO2 99%   BMI 30.45 kg/m   Physical Exam Constitutional:      Appearance: Normal appearance. She is normal weight.  HENT:     Head: Normocephalic.     Right Ear: Tympanic membrane normal.     Left Ear: Tympanic membrane normal.     Mouth/Throat:     Mouth: Mucous membranes are moist.  Eyes:     Extraocular Movements: Extraocular movements intact.     Conjunctiva/sclera: Conjunctivae normal.     Pupils: Pupils are equal, round, and reactive to light.  Neck:     Thyroid: No thyroid mass or thyroid tenderness.  Cardiovascular:     Rate and Rhythm: Normal rate and regular rhythm.     Pulses: Normal pulses.     Heart sounds: Normal heart sounds. No murmur heard. Pulmonary:     Effort: Pulmonary effort is normal.     Breath sounds: Normal breath sounds.  Abdominal:     General: Bowel sounds are normal.     Palpations: Abdomen is soft. There is no mass.     Tenderness: There is no abdominal tenderness. There is no rebound.  Musculoskeletal:        General: No swelling.     Cervical back: Neck supple. No tenderness.     Right lower leg: No edema.     Left lower leg: No edema.  Skin:    Findings: No bruising, erythema or rash.  Neurological:     General: No focal deficit present.     Mental Status: She is alert and oriented to person, place, and time. Mental status is at baseline.  Psychiatric:        Mood and Affect: Mood normal.        Behavior: Behavior normal.        Thought Content: Thought content normal.        Judgment: Judgment normal.         Assessment & Plan:  Elevated blood pressure reading without diagnosis of hypertension Assessment & Plan: Patient BP  Vitals:   07/29/22 1459  BP: 124/82    in the office 08/09/22  Advised her to check blood pressure at home and bring readings to the next appointment. Labs ordered.   Orders: -     CBC with Differential/Platelet; Future -     Comprehensive metabolic panel; Future -     Lipid  panel; Future  Allergic conjunctivitis of both eyes Assessment & Plan: No discharge or redness at present. Rx olopatadine sent to the pharmacy.   Anxiety and depression Assessment & Plan: Her PHQ-9  score is 8 and GAD score: 6 Will check labs TSH and CBCs  Orders: -     CBC with Differential/Platelet; Future -     TSH; Future  Obesity (BMI 30-39.9) Assessment & Plan: Body mass index is 30.45 kg/m. Advised pt to lose weight. Advised patient to avoid trans fat, fatty and fried food. Follow a regular physical activity schedule.      Other orders -     Olopatadine HCl; Place 1 drop into both eyes 2 (two) times daily.  Dispense: 5 mL; Refill: 12    Return for fastings lab and 2 weeks follow up.   Theresia Lo, NP

## 2022-08-01 ENCOUNTER — Other Ambulatory Visit (INDEPENDENT_AMBULATORY_CARE_PROVIDER_SITE_OTHER): Payer: Medicaid Other

## 2022-08-01 DIAGNOSIS — F419 Anxiety disorder, unspecified: Secondary | ICD-10-CM | POA: Diagnosis not present

## 2022-08-01 DIAGNOSIS — F32A Depression, unspecified: Secondary | ICD-10-CM | POA: Diagnosis not present

## 2022-08-01 DIAGNOSIS — R030 Elevated blood-pressure reading, without diagnosis of hypertension: Secondary | ICD-10-CM | POA: Diagnosis not present

## 2022-08-01 LAB — COMPREHENSIVE METABOLIC PANEL
ALT: 11 U/L (ref 0–35)
AST: 12 U/L (ref 0–37)
Albumin: 4.3 g/dL (ref 3.5–5.2)
Alkaline Phosphatase: 90 U/L (ref 39–117)
BUN: 14 mg/dL (ref 6–23)
CO2: 23 mEq/L (ref 19–32)
Calcium: 9.7 mg/dL (ref 8.4–10.5)
Chloride: 102 mEq/L (ref 96–112)
Creatinine, Ser: 0.98 mg/dL (ref 0.40–1.20)
GFR: 70.37 mL/min (ref >60.00)
Glucose, Bld: 87 mg/dL (ref 70–99)
Potassium: 3.9 mEq/L (ref 3.5–5.1)
Sodium: 135 mEq/L (ref 135–145)
Total Bilirubin: 0.6 mg/dL (ref 0.2–1.2)
Total Protein: 7.7 g/dL (ref 6.0–8.3)

## 2022-08-01 LAB — CBC WITH DIFFERENTIAL/PLATELET
Basophils Absolute: 0.1 10*3/uL (ref 0.0–0.1)
Basophils Relative: 0.6 % (ref 0.0–3.0)
Eosinophils Absolute: 0 10*3/uL (ref 0.0–0.7)
Eosinophils Relative: 0.5 % (ref 0.0–5.0)
HCT: 39.6 % (ref 36.0–46.0)
Hemoglobin: 13.2 g/dL (ref 12.0–15.0)
Lymphocytes Relative: 33.6 % (ref 12.0–46.0)
Lymphs Abs: 2.8 10*3/uL (ref 0.7–4.0)
MCHC: 33.2 g/dL (ref 30.0–36.0)
MCV: 91.1 fl (ref 78.0–100.0)
Monocytes Absolute: 0.6 10*3/uL (ref 0.1–1.0)
Monocytes Relative: 6.7 % (ref 3.0–12.0)
Neutro Abs: 5 10*3/uL (ref 1.4–7.7)
Neutrophils Relative %: 58.6 % (ref 43.0–77.0)
Platelets: 284 10*3/uL (ref 150.0–400.0)
RBC: 4.35 Mil/uL (ref 3.87–5.11)
RDW: 13.1 % (ref 11.5–15.5)
WBC: 8.5 10*3/uL (ref 4.0–10.5)

## 2022-08-01 LAB — LIPID PANEL
Cholesterol: 129 mg/dL (ref 0–200)
HDL: 49.1 mg/dL (ref >39.00)
LDL Cholesterol: 64 mg/dL (ref 0–99)
NonHDL: 80.27
Total CHOL/HDL Ratio: 3
Triglycerides: 82 mg/dL (ref 0.0–149.0)
VLDL: 16.4 mg/dL (ref 0.0–40.0)

## 2022-08-01 LAB — TSH: TSH: 1.37 u[IU]/mL (ref 0.35–5.50)

## 2022-08-02 ENCOUNTER — Other Ambulatory Visit: Payer: Medicaid Other

## 2022-08-05 ENCOUNTER — Other Ambulatory Visit: Payer: Medicaid Other

## 2022-08-09 ENCOUNTER — Encounter: Payer: Self-pay | Admitting: Nurse Practitioner

## 2022-08-09 NOTE — Assessment & Plan Note (Signed)
No discharge or redness at present. Rx olopatadine sent to the pharmacy.

## 2022-08-09 NOTE — Assessment & Plan Note (Signed)
Her PHQ-9 score is 8 and GAD score: 6 Will check labs TSH and CBCs

## 2022-08-09 NOTE — Assessment & Plan Note (Signed)
Body mass index is 30.45 kg/m. Advised pt to lose weight. Advised patient to avoid trans fat, fatty and fried food. Follow a regular physical activity schedule.

## 2022-08-09 NOTE — Assessment & Plan Note (Signed)
Patient BP  Vitals:   07/29/22 1459  BP: 124/82    in the office 08/09/22  Advised her to check blood pressure at home and bring readings to the next appointment. Labs ordered.

## 2022-08-17 ENCOUNTER — Other Ambulatory Visit: Payer: Medicaid Other

## 2022-08-18 ENCOUNTER — Ambulatory Visit (INDEPENDENT_AMBULATORY_CARE_PROVIDER_SITE_OTHER): Payer: Medicaid Other | Admitting: Nurse Practitioner

## 2022-08-18 ENCOUNTER — Ambulatory Visit: Payer: Medicaid Other | Attending: Nurse Practitioner

## 2022-08-18 ENCOUNTER — Encounter: Payer: Self-pay | Admitting: Nurse Practitioner

## 2022-08-18 VITALS — BP 122/80 | HR 93 | Temp 98.0°F | Ht 67.0 in | Wt 182.0 lb

## 2022-08-18 DIAGNOSIS — R030 Elevated blood-pressure reading, without diagnosis of hypertension: Secondary | ICD-10-CM | POA: Diagnosis not present

## 2022-08-18 DIAGNOSIS — R002 Palpitations: Secondary | ICD-10-CM | POA: Diagnosis not present

## 2022-08-18 NOTE — Patient Instructions (Signed)
Zio monitor ordered.

## 2022-08-18 NOTE — Progress Notes (Signed)
Established Patient Office Visit  Subjective    Patient ID: Tammy Ray, female    DOB: 1981-02-20  Age: 42 y.o. MRN: [id number]  CC:  Chief Complaint  Patient presents with   Medical Management of Chronic Issues    HPI Tammy Ray presents to follow up on elevated BP readings. She checked her  BP at home and the number 92/52, 133/74, She fasted for 3 days. She had mineral and electrolyte water.  She states she has better energy level now.   She has off and on palpitation sometimes twice a month past 2 years but recently have frequent episodes of palpitations.  Denies shortness of breath, chest pain, fatigue or palpitation at present.  Health Maintenance  Topic Date Due   COVID-19 Vaccine (1) Never done   Hepatitis C Screening  Never done   PAP SMEAR-Modifier  06/28/2021   INFLUENZA VACCINE  08/21/2022 (Originally 12/21/2021)   DTaP/Tdap/Td (2 - Td or Tdap) 05/08/2028   HIV Screening  Completed   HPV VACCINES  Aged Out    There are no preventive care reminders to display for this patient.  Outpatient Encounter Medications as of 08/18/2022  Medication Sig   Ascorbic Acid (VITA-C PO) Take 1 tablet by mouth daily.   Cholecalciferol (VITAMIN D PO) Take 1 tablet by mouth daily.   ferrous sulfate 325 (65 FE) MG EC tablet Take 325 mg by mouth 3 (three) times daily with meals.   lactobacillus acidophilus (BACID) TABS tablet Take 2 tablets by mouth 3 (three) times daily.   Multiple Vitamin (MULTIVITAMIN) tablet Take 1 tablet by mouth daily.   olopatadine (PATANOL) 0.1 % ophthalmic solution Place 1 drop into both eyes 2 (two) times daily.   No facility-administered encounter medications on file as of 08/18/2022.    Past Medical History:  Diagnosis Date   Anxiety    Bilateral leg cramps    Depression    Kidney infection    UTI (lower urinary tract infection)     Past Surgical History:  Procedure Laterality Date    CESAREAN SECTION     LAPAROSCOPIC TUBAL LIGATION Bilateral 05/26/2016   Procedure: LAPAROSCOPIC TUBAL LIGATION;  Surgeon: Chancy Milroy, MD;  Location: [facility];  Service: Gynecology;  Laterality: Bilateral;    Family History  Problem Relation Age of Onset   Seizures Mother    Heart disease Father    Cancer Sister    Breast cancer Maternal Grandmother    Breast cancer Maternal Aunt     Social History   Socioeconomic History   Marital status: Single    Spouse name: Not on file   Number of children: 3   Years of education: Not on file   Highest education level: Associate degree: academic program  Occupational History   Not on file  Tobacco Use   Smoking status: Former    Types: Cigarettes    Quit date: 11/19/2013    Years since quitting: 8.7   Smokeless tobacco: Never  Vaping Use   Vaping Use: Never used  Substance and Sexual Activity   Alcohol use: Yes    Comment: ocassional   Drug use: No  Sexual activity: Not Currently  Other Topics Concern   Not on file  Social History Narrative   Not on file   Social Determinants of Health   Financial Resource Strain: Not on file  Food Insecurity: Not on file  Transportation Needs: Not on file  Physical Activity: Not on file  Stress: Not on file  Social Connections: Not on file  Intimate Partner Violence: Not on file    Review of Systems  Constitutional: Negative.   Respiratory:  Negative for cough and shortness of breath.   Cardiovascular:  Positive for palpitations. Negative for chest pain and claudication.  Gastrointestinal: Negative.   Neurological: Negative.   Psychiatric/Behavioral: Negative.          Objective    BP 122/80   Pulse 93   Temp 98 F (36.7 C)   Ht 5\' 7"  (1.702 m)   Wt 182 lb (82.6 kg)   SpO2 99%   BMI 28.51 kg/m   Physical Exam Constitutional:      Appearance: Normal appearance. She is normal weight.  HENT:     Head: Normocephalic.     Right Ear: Tympanic membrane normal.      Left Ear: Tympanic membrane normal.     Mouth/Throat:     Mouth: Mucous membranes are moist.  Eyes:     Extraocular Movements: Extraocular movements intact.     Conjunctiva/sclera: Conjunctivae normal.     Pupils: Pupils are equal, round, and reactive to light.  Neck:     Thyroid: No thyroid mass or thyroid tenderness.  Cardiovascular:     Rate and Rhythm: Normal rate and regular rhythm.     Pulses: Normal pulses.     Heart sounds: Normal heart sounds. No murmur heard. Pulmonary:     Effort: Pulmonary effort is normal.     Breath sounds: Normal breath sounds.  Abdominal:     General: Bowel sounds are normal.     Palpations: Abdomen is soft. There is no mass.     Tenderness: There is no abdominal tenderness. There is no rebound.  Musculoskeletal:        General: No swelling.     Cervical back: Neck supple. No tenderness.     Right lower leg: No edema.     Left lower leg: No edema.  Skin:    Findings: No bruising, erythema or rash.  Neurological:     General: No focal deficit present.     Mental Status: She is alert and oriented to person, place, and time. Mental status is at baseline.  Psychiatric:        Mood and Affect: Mood normal.        Behavior: Behavior normal.        Thought Content: Thought content normal.        Judgment: Judgment normal.         Assessment & Plan:  Palpitation Assessment & Plan: Palpitation off and on from last 2 years but recently more frequent episodes. Zio monitor ordered.  Orders: -     LONG TERM MONITOR (3-14 DAYS); Future  Elevated BP without diagnosis of hypertension Assessment & Plan: Patient BP  Vitals:   08/18/22 1554  BP: 122/80    in the office. Advised pt to follow a low sodium and heart healthy diet. We will continue to monitor.     Return in about 6 months (around 02/18/2023).   Theresia Lo, NP

## 2022-08-21 DIAGNOSIS — R002 Palpitations: Secondary | ICD-10-CM | POA: Insufficient documentation

## 2022-08-21 NOTE — Assessment & Plan Note (Signed)
Palpitation off and on from last 2 years but recently more frequent episodes. Zio monitor ordered.

## 2022-08-21 NOTE — Assessment & Plan Note (Addendum)
Patient BP  Vitals:   08/18/22 1554  BP: 122/80    in the office. Advised pt to follow a low sodium and heart healthy diet. We will continue to monitor. Lab result discussed.

## 2022-08-22 DIAGNOSIS — Z419 Encounter for procedure for purposes other than remedying health state, unspecified: Secondary | ICD-10-CM | POA: Diagnosis not present

## 2022-08-23 DIAGNOSIS — R002 Palpitations: Secondary | ICD-10-CM | POA: Diagnosis not present

## 2022-09-21 DIAGNOSIS — Z419 Encounter for procedure for purposes other than remedying health state, unspecified: Secondary | ICD-10-CM | POA: Diagnosis not present

## 2022-10-22 DIAGNOSIS — Z419 Encounter for procedure for purposes other than remedying health state, unspecified: Secondary | ICD-10-CM | POA: Diagnosis not present

## 2022-11-21 DIAGNOSIS — Z419 Encounter for procedure for purposes other than remedying health state, unspecified: Secondary | ICD-10-CM | POA: Diagnosis not present

## 2022-12-22 DIAGNOSIS — Z419 Encounter for procedure for purposes other than remedying health state, unspecified: Secondary | ICD-10-CM | POA: Diagnosis not present

## 2023-01-22 DIAGNOSIS — Z419 Encounter for procedure for purposes other than remedying health state, unspecified: Secondary | ICD-10-CM | POA: Diagnosis not present

## 2023-02-21 DIAGNOSIS — Z419 Encounter for procedure for purposes other than remedying health state, unspecified: Secondary | ICD-10-CM | POA: Diagnosis not present

## 2023-02-23 ENCOUNTER — Encounter: Payer: Self-pay | Admitting: Nurse Practitioner

## 2023-02-23 ENCOUNTER — Other Ambulatory Visit (HOSPITAL_COMMUNITY)
Admission: RE | Admit: 2023-02-23 | Discharge: 2023-02-23 | Disposition: A | Discharge status: Home / Self Care | Payer: Medicaid Other | Source: Ambulatory Visit | Attending: Nurse Practitioner | Admitting: Nurse Practitioner

## 2023-02-23 ENCOUNTER — Ambulatory Visit: Payer: Medicaid Other | Admitting: Nurse Practitioner

## 2023-02-23 VITALS — BP 120/76 | HR 77 | Temp 98.1°F | Ht 67.0 in | Wt 177.8 lb

## 2023-02-23 DIAGNOSIS — Z1211 Encounter for screening for malignant neoplasm of colon: Secondary | ICD-10-CM | POA: Insufficient documentation

## 2023-02-23 DIAGNOSIS — Z1321 Encounter for screening for nutritional disorder: Secondary | ICD-10-CM | POA: Diagnosis not present

## 2023-02-23 DIAGNOSIS — Z124 Encounter for screening for malignant neoplasm of cervix: Secondary | ICD-10-CM | POA: Insufficient documentation

## 2023-02-23 DIAGNOSIS — Z1231 Encounter for screening mammogram for malignant neoplasm of breast: Secondary | ICD-10-CM

## 2023-02-23 DIAGNOSIS — Z13220 Encounter for screening for lipoid disorders: Secondary | ICD-10-CM

## 2023-02-23 DIAGNOSIS — Z0000 Encounter for general adult medical examination without abnormal findings: Secondary | ICD-10-CM

## 2023-02-23 LAB — LIPID PANEL
Cholesterol: 130 mg/dL (ref 0–200)
HDL: 54.5 mg/dL (ref >39.00)
LDL Cholesterol: 64 mg/dL (ref 0–99)
NonHDL: 75.85
Total CHOL/HDL Ratio: 2
Triglycerides: 61 mg/dL (ref 0.0–149.0)
VLDL: 12.2 mg/dL (ref 0.0–40.0)

## 2023-02-23 LAB — COMPREHENSIVE METABOLIC PANEL
ALT: 12 U/L (ref 0–35)
AST: 14 U/L (ref 0–37)
Albumin: 4.2 g/dL (ref 3.5–5.2)
Alkaline Phosphatase: 125 U/L — ABNORMAL HIGH (ref 39–117)
BUN: 14 mg/dL (ref 6–23)
CO2: 30 meq/L (ref 19–32)
Calcium: 9.6 mg/dL (ref 8.4–10.5)
Chloride: 105 meq/L (ref 96–112)
Creatinine, Ser: 0.9 mg/dL (ref 0.40–1.20)
GFR: 77.63 mL/min (ref >60.00)
Glucose, Bld: 94 mg/dL (ref 70–99)
Potassium: 4 meq/L (ref 3.5–5.1)
Sodium: 139 meq/L (ref 135–145)
Total Bilirubin: 0.4 mg/dL (ref 0.2–1.2)
Total Protein: 6.9 g/dL (ref 6.0–8.3)

## 2023-02-23 LAB — CBC WITH DIFFERENTIAL/PLATELET
Basophils Absolute: 0 10*3/uL (ref 0.0–0.1)
Basophils Relative: 0.4 % (ref 0.0–3.0)
Eosinophils Absolute: 0 10*3/uL (ref 0.0–0.7)
Eosinophils Relative: 0.6 % (ref 0.0–5.0)
HCT: 37.6 % (ref 36.0–46.0)
Hemoglobin: 12.1 g/dL (ref 12.0–15.0)
Lymphocytes Relative: 43.3 % (ref 12.0–46.0)
Lymphs Abs: 2.3 10*3/uL (ref 0.7–4.0)
MCHC: 32.1 g/dL (ref 30.0–36.0)
MCV: 92.4 fL (ref 78.0–100.0)
Monocytes Absolute: 0.4 10*3/uL (ref 0.1–1.0)
Monocytes Relative: 6.7 % (ref 3.0–12.0)
Neutro Abs: 2.6 10*3/uL (ref 1.4–7.7)
Neutrophils Relative %: 49 % (ref 43.0–77.0)
Platelets: 321 10*3/uL (ref 150.0–400.0)
RBC: 4.07 Mil/uL (ref 3.87–5.11)
RDW: 12.7 % (ref 11.5–15.5)
WBC: 5.3 10*3/uL (ref 4.0–10.5)

## 2023-02-23 LAB — VITAMIN D 25 HYDROXY (VIT D DEFICIENCY, FRACTURES): VITD: 27.43 ng/mL — ABNORMAL LOW (ref 30.00–100.00)

## 2023-02-23 LAB — TSH: TSH: 0.45 u[IU]/mL (ref 0.35–5.50)

## 2023-02-23 LAB — VITAMIN B12: Vitamin B-12: 731 pg/mL (ref 211–911)

## 2023-02-23 NOTE — Progress Notes (Signed)
Established Patient Office Visit  Subjective:  Patient ID: Tammy Ray, female    DOB: [birth year]  Age: 42 y.o. MRN: [id number]  CC:  Chief Complaint  Patient presents with   Annual Exam    Annual exam with Pap    HPI  Tammy Ray presents for annual physical.   Flu: due Tetanus: 2019 COVID: Moderna X2  Pap smear: due Mammogram: due  Dentist: due  Eye examination: 2023 Exercise: walking/ exercise for 30 mint 3-4 times a week.   Family history of Breast cancer: Grandmother and maternal aunt.  Sexually active: Use condoms and had tubal ligation. Menses: Regular last 5-7 days, heavy first 2 days.    Diet: Patient does eat meat(chicken, beef [place] and sea food). Patient consumes fruits and veggies. Patient eat some fried food. Patient drinks water and tea.    HPI   Past Medical History:  Diagnosis Date   Anxiety    Bilateral leg cramps    Depression    Kidney infection    UTI (lower urinary tract infection)     Past Surgical History:  Procedure Laterality Date   CESAREAN SECTION     LAPAROSCOPIC TUBAL LIGATION Bilateral 05/26/2016   Procedure: LAPAROSCOPIC TUBAL LIGATION;  Surgeon: Hermina Staggers, MD;  Location: [facility];  Service: Gynecology;  Laterality: Bilateral;    Family History  Problem Relation Age of Onset   Seizures Mother    Heart disease Father    Cancer Sister    Breast cancer Maternal Grandmother    Breast cancer Maternal Aunt     Social History   Socioeconomic History   Marital status: Single    Spouse name: Not on file   Number of children: 3   Years of education: Not on file   Highest education level: Associate degree: academic program  Occupational History   Not on file  Tobacco Use   Smoking status: Former    Current packs/day: 0.00    Types: Cigarettes    Quit date: 11/19/2013    Years since quitting: 9.2   Smokeless tobacco: Never  Vaping Use   Vaping status: Never Used  Substance and Sexual Activity   Alcohol  use: Yes    Comment: ocassional   Drug use: No   Sexual activity: Yes    Birth control/protection: Condom  Other Topics Concern   Not on file  Social History Narrative   Not on file   Social Determinants of Health   Financial Resource Strain: Not on file  Food Insecurity: Not on file  Transportation Needs: Not on file  Physical Activity: Not on file  Stress: Not on file  Social Connections: Not on file  Intimate Partner Violence: Not on file     Outpatient Medications Prior to Visit  Medication Sig Dispense Refill   Ascorbic Acid (VITA-C PO) Take 1 tablet by mouth daily.     Cholecalciferol (VITAMIN D PO) Take 1 tablet by mouth daily.     ferrous sulfate 325 (65 FE) MG EC tablet Take 325 mg by mouth 3 (three) times daily with meals.     lactobacillus acidophilus (BACID) TABS tablet Take 2 tablets by mouth 3 (three) times daily.     Multiple Vitamin (MULTIVITAMIN) tablet Take 1 tablet by mouth daily.     olopatadine (PATANOL) 0.1 % ophthalmic solution Place 1 drop into both eyes 2 (two) times daily. 5 mL 12   No facility-administered medications prior to visit.    Allergies  Allergen  Reactions   Oatmeal Rash    ROS Review of Systems Negative unless indicated in HPI.    Objective:    Physical Exam Exam conducted with a chaperone present.  Constitutional:      Appearance: Normal appearance. She is normal weight.  HENT:     Head: Normocephalic.     Right Ear: Tympanic membrane normal.     Left Ear: Tympanic membrane normal.     Mouth/Throat:     Mouth: Mucous membranes are moist.  Eyes:     Extraocular Movements: Extraocular movements intact.     Conjunctiva/sclera: Conjunctivae normal.     Pupils: Pupils are equal, round, and reactive to light.  Neck:     Thyroid: No thyroid mass or thyroid tenderness.  Cardiovascular:     Rate and Rhythm: Normal rate and regular rhythm.     Pulses: Normal pulses.     Heart sounds: Normal heart sounds. No murmur  heard. Pulmonary:     Effort: Pulmonary effort is normal.     Breath sounds: Normal breath sounds. No stridor. No wheezing.  Chest:     Chest wall: No mass or tenderness.  Breasts:    Right: Normal. No mass or nipple discharge.     Left: Normal. No mass or nipple discharge.  Abdominal:     General: Bowel sounds are normal.     Palpations: Abdomen is soft. There is no mass.     Tenderness: There is no abdominal tenderness. There is no rebound.  Genitourinary:    Vagina: Normal. No vaginal discharge, erythema or lesions.     Cervix: Normal.  Musculoskeletal:        General: No swelling.     Cervical back: Normal range of motion and neck supple. No tenderness.     Right lower leg: No edema.     Left lower leg: No edema.  Lymphadenopathy:     Upper Body:     Right upper body: No supraclavicular adenopathy.     Left upper body: No supraclavicular adenopathy.  Skin:    Findings: No bruising, erythema or rash.  Neurological:     General: No focal deficit present.     Mental Status: She is alert and oriented to person, place, and time. Mental status is at baseline.  Psychiatric:        Mood and Affect: Mood normal.        Behavior: Behavior normal.        Thought Content: Thought content normal.        Judgment: Judgment normal.     BP 120/76   Pulse 77   Temp 98.1 F (36.7 C)   Ht 5\' 7"  (1.702 m)   Wt 177 lb 12.8 oz (80.6 kg)   SpO2 99%   BMI 27.85 kg/m  Wt Readings from Last 3 Encounters:  02/23/23 177 lb 12.8 oz (80.6 kg)  08/18/22 182 lb (82.6 kg)  07/29/22 194 lb 6.4 oz (88.2 kg)     Health Maintenance  Topic Date Due   Hepatitis C Screening  Never done   COVID-19 Vaccine (1 - 2023-24 season) 03/11/2023 (Originally 01/22/2023)   INFLUENZA VACCINE  08/21/2023 (Originally 12/22/2022)   Cervical Cancer Screening (HPV/Pap Cotest)  06/29/2023   DTaP/Tdap/Td (2 - Td or Tdap) 05/08/2028   HIV Screening  Completed   HPV VACCINES  Aged Out    There are no preventive  care reminders to display for this patient.  Lab Results  Component Value  Date   TSH 1.37 08/01/2022   Lab Results  Component Value Date   WBC 8.5 08/01/2022   HGB 13.2 08/01/2022   HCT 39.6 08/01/2022   MCV 91.1 08/01/2022   PLT 284.0 08/01/2022   Lab Results  Component Value Date   NA 135 08/01/2022   K 3.9 08/01/2022   CO2 23 08/01/2022   GLUCOSE 87 08/01/2022   BUN 14 08/01/2022   CREATININE 0.98 08/01/2022   BILITOT 0.6 08/01/2022   ALKPHOS 90 08/01/2022   AST 12 08/01/2022   ALT 11 08/01/2022   PROT 7.7 08/01/2022   ALBUMIN 4.3 08/01/2022   CALCIUM 9.7 08/01/2022   ANIONGAP 13 01/20/2014   GFR 70.37 08/01/2022   Lab Results  Component Value Date   CHOL 129 08/01/2022   Lab Results  Component Value Date   HDL 49.10 08/01/2022   Lab Results  Component Value Date   LDLCALC 64 08/01/2022   Lab Results  Component Value Date   TRIG 82.0 08/01/2022   Lab Results  Component Value Date   CHOLHDL 3 08/01/2022   Lab Results  Component Value Date   HGBA1C 5.1 02/04/2016      Assessment & Plan:  Annual physical exam Assessment & Plan: Encouraged patient to consume a balanced diet and regular exercise regimen. Advised to see an eye doctor and dentist annually.  PAP and mammogram  ordered. Pt tolerated well. Labs ordered.   Orders: -     CBC with Differential/Platelet -     Comprehensive metabolic panel -     Lipid panel -     TSH -     Vitamin B12  Encounter for vitamin deficiency screening -     VITAMIN D 25 Hydroxy (Vit-D Deficiency, Fractures)  Cervical cancer screening -     Cytology - PAP  Screening mammogram, encounter for -     3D Screening Mammogram, Left and Right; Future    Follow-up: Return in about 6 months (around 08/24/2023).   Kara Dies, NP

## 2023-02-23 NOTE — Assessment & Plan Note (Addendum)
Encouraged patient to consume a balanced diet and regular exercise regimen. Advised to see an eye doctor and dentist annually.  PAP and mammogram  ordered. Pt tolerated well. Labs ordered.

## 2023-02-23 NOTE — Patient Instructions (Signed)
YOUR MAMMOGRAM IS DUE, PLEASE CALL AND GET THIS SCHEDULED! [facility] - call [phone]   Preventive Care 1-44 Years Old, Female Preventive care refers to lifestyle choices and visits with your health care provider that can promote health and wellness. Preventive care visits are also called wellness exams. What can I expect for my preventive care visit? Counseling Your health care provider may ask you questions about your: Medical history, including: Past medical problems. Family medical history. Pregnancy history. Current health, including: Menstrual cycle. Method of birth control. Emotional well-being. Home life and relationship well-being. Sexual activity and sexual health. Lifestyle, including: Alcohol, nicotine or tobacco, and drug use. Access to firearms. Diet, exercise, and sleep habits. Work and work Astronomer. Sunscreen use. Safety issues such as seatbelt and bike helmet use. Physical exam Your health care provider will check your: Height and weight. These may be used to calculate your BMI (body mass index). BMI is a measurement that tells if you are at a healthy weight. Waist circumference. This measures the distance around your waistline. This measurement also tells if you are at a healthy weight and may help predict your risk of certain diseases, such as type 2 diabetes and high blood pressure. Heart rate and blood pressure. Body temperature. Skin for abnormal spots. What immunizations do I need?  Vaccines are usually given at various ages, according to a schedule. Your health care provider will recommend vaccines for you based on your age, medical history, and lifestyle or other factors, such as travel or where you work. What tests do I need? Screening Your health care provider may recommend screening tests for certain conditions. This may include: Lipid and cholesterol levels. Diabetes screening. This is done by checking your blood sugar (glucose)  after you have not eaten for a while (fasting). Pelvic exam and Pap test. Hepatitis B test. Hepatitis C test. HIV (human immunodeficiency virus) test. STI (sexually transmitted infection) testing, if you are at risk. Lung cancer screening. Colorectal cancer screening. Mammogram. Talk with your health care provider about when you should start having regular mammograms. This may depend on whether you have a family history of breast cancer. BRCA-related cancer screening. This may be done if you have a family history of breast, ovarian, tubal, or peritoneal cancers. Bone density scan. This is done to screen for osteoporosis. Talk with your health care provider about your test results, treatment options, and if necessary, the need for more tests. Follow these instructions at home: Eating and drinking  Eat a diet that includes fresh fruits and vegetables, whole grains, lean protein, and low-fat dairy products. Take vitamin and mineral supplements as recommended by your health care provider. Do not drink alcohol if: Your health care provider tells you not to drink. You are pregnant, may be pregnant, or are planning to become pregnant. If you drink alcohol: Limit how much you have to 0-1 drink a day. Know how much alcohol is in your drink. In the [place] one drink equals one 12 oz bottle of beer (355 mL), one 5 oz glass of wine (148 mL), or one 1 oz glass of hard liquor (44 mL). Lifestyle Brush your teeth every morning and night with fluoride toothpaste. Floss one time each day. Exercise for at least 30 minutes 5 or more days each week. Do not use any products that contain nicotine or tobacco. These products include cigarettes, chewing tobacco, and vaping devices, such as e-cigarettes. If you need help quitting, ask your health care provider. Do not use  drugs. If you are sexually active, practice safe sex. Use a condom or other form of protection to prevent STIs. If you do not wish to become  pregnant, use a form of birth control. If you plan to become pregnant, see your health care provider for a prepregnancy visit. Take aspirin only as told by your health care provider. Make sure that you understand how much to take and what form to take. Work with your health care provider to find out whether it is safe and beneficial for you to take aspirin daily. Find healthy ways to manage stress, such as: Meditation, yoga, or listening to music. Journaling. Talking to a trusted person. Spending time with friends and family. Minimize exposure to UV radiation to reduce your risk of skin cancer. Safety Always wear your seat belt while driving or riding in a vehicle. Do not drive: If you have been drinking alcohol. Do not ride with someone who has been drinking. When you are tired or distracted. While texting. If you have been using any mind-altering substances or drugs. Wear a helmet and other protective equipment during sports activities. If you have firearms in your house, make sure you follow all gun safety procedures. Seek help if you have been physically or sexually abused. What's next? Visit your health care provider once a year for an annual wellness visit. Ask your health care provider how often you should have your eyes and teeth checked. Stay up to date on all vaccines. This information is not intended to replace advice given to you by your health care provider. Make sure you discuss any questions you have with your health care provider. Document Revised: 11/04/2020 Document Reviewed: 11/04/2020 Elsevier Patient Education  2024 [employer].

## 2023-02-27 ENCOUNTER — Encounter: Payer: Self-pay | Admitting: Nurse Practitioner

## 2023-02-27 LAB — CYTOLOGY - PAP
Adequacy: ABSENT
Chlamydia: NEGATIVE
Comment: NEGATIVE
Comment: NEGATIVE
Comment: NEGATIVE
Comment: NORMAL
Diagnosis: NEGATIVE
High risk HPV: NEGATIVE
Neisseria Gonorrhea: NEGATIVE
Trichomonas: POSITIVE — AB

## 2023-02-28 ENCOUNTER — Encounter: Payer: Self-pay | Admitting: Nurse Practitioner

## 2023-02-28 ENCOUNTER — Other Ambulatory Visit: Payer: Self-pay | Admitting: Nurse Practitioner

## 2023-02-28 MED ORDER — METRONIDAZOLE 500 MG PO TABS: 500.0000 mg | ORAL_TABLET | Freq: Three times a day (TID) | ORAL | 0 refills | Status: AC

## 2023-02-28 NOTE — Progress Notes (Signed)
Please inform the patient Pap is negative for HPV but positive for trichomonas.I have sent metronidazole to the pharmacy take it twice a day for 7 days.  I have sent sent you some information about trichomonas via MyChart.  The blood work shows no sign of anemia, electrolytes normal. TSH, cholesterol, vitamin B12, kidneys are normal Vitamin D slightly on the lower side take over-the-counter vitamin D supplement daily. Liver function alkaline phosphate slightly elevated will repeat in 8 weeks. Please let us know if you have any other questions.

## 2023-03-24 DIAGNOSIS — Z419 Encounter for procedure for purposes other than remedying health state, unspecified: Secondary | ICD-10-CM | POA: Diagnosis not present

## 2023-04-18 ENCOUNTER — Telehealth: Payer: Self-pay | Admitting: Nurse Practitioner

## 2023-04-18 NOTE — Telephone Encounter (Signed)
Patient need lab orders.

## 2023-04-19 ENCOUNTER — Other Ambulatory Visit: Payer: Self-pay | Admitting: Nurse Practitioner

## 2023-04-19 DIAGNOSIS — R748 Abnormal levels of other serum enzymes: Secondary | ICD-10-CM

## 2023-04-19 NOTE — Telephone Encounter (Signed)
Labs has been ordered.

## 2023-04-23 DIAGNOSIS — Z419 Encounter for procedure for purposes other than remedying health state, unspecified: Secondary | ICD-10-CM | POA: Diagnosis not present

## 2023-04-25 ENCOUNTER — Other Ambulatory Visit (INDEPENDENT_AMBULATORY_CARE_PROVIDER_SITE_OTHER): Payer: Medicaid Other

## 2023-04-25 DIAGNOSIS — R748 Abnormal levels of other serum enzymes: Secondary | ICD-10-CM | POA: Diagnosis not present

## 2023-04-25 LAB — HEPATIC FUNCTION PANEL
ALT: 9 U/L (ref 0–35)
AST: 12 U/L (ref 0–37)
Albumin: 4.1 g/dL (ref 3.5–5.2)
Alkaline Phosphatase: 87 U/L (ref 39–117)
Bilirubin, Direct: 0 mg/dL (ref 0.0–0.3)
Total Bilirubin: 0.5 mg/dL (ref 0.2–1.2)
Total Protein: 7 g/dL (ref 6.0–8.3)

## 2023-04-25 LAB — GAMMA GT: GGT: 7 U/L (ref 7–51)

## 2023-05-24 DIAGNOSIS — Z419 Encounter for procedure for purposes other than remedying health state, unspecified: Secondary | ICD-10-CM | POA: Diagnosis not present

## 2023-06-24 DIAGNOSIS — Z419 Encounter for procedure for purposes other than remedying health state, unspecified: Secondary | ICD-10-CM | POA: Diagnosis not present

## 2023-07-22 DIAGNOSIS — Z419 Encounter for procedure for purposes other than remedying health state, unspecified: Secondary | ICD-10-CM | POA: Diagnosis not present

## 2023-08-24 ENCOUNTER — Ambulatory Visit: Payer: Medicaid Other | Admitting: Nurse Practitioner

## 2023-09-02 DIAGNOSIS — Z419 Encounter for procedure for purposes other than remedying health state, unspecified: Secondary | ICD-10-CM | POA: Diagnosis not present

## 2023-09-07 ENCOUNTER — Encounter: Payer: Self-pay | Admitting: Nurse Practitioner

## 2023-09-07 ENCOUNTER — Telehealth: Payer: Self-pay

## 2023-09-07 ENCOUNTER — Ambulatory Visit (INDEPENDENT_AMBULATORY_CARE_PROVIDER_SITE_OTHER): Admitting: Nurse Practitioner

## 2023-09-07 ENCOUNTER — Other Ambulatory Visit: Payer: Self-pay

## 2023-09-07 ENCOUNTER — Ambulatory Visit
Admission: RE | Admit: 2023-09-07 | Discharge: 2023-09-07 | Disposition: A | Discharge status: Home / Self Care | Payer: Medicaid Other | Source: Ambulatory Visit | Attending: Nurse Practitioner | Admitting: Nurse Practitioner

## 2023-09-07 VITALS — BP 122/80 | HR 69 | Temp 97.7°F | Ht 67.0 in | Wt 178.8 lb

## 2023-09-07 DIAGNOSIS — Z1159 Encounter for screening for other viral diseases: Secondary | ICD-10-CM

## 2023-09-07 DIAGNOSIS — Z0000 Encounter for general adult medical examination without abnormal findings: Secondary | ICD-10-CM | POA: Diagnosis not present

## 2023-09-07 DIAGNOSIS — F419 Anxiety disorder, unspecified: Secondary | ICD-10-CM | POA: Diagnosis not present

## 2023-09-07 DIAGNOSIS — E559 Vitamin D deficiency, unspecified: Secondary | ICD-10-CM | POA: Diagnosis not present

## 2023-09-07 DIAGNOSIS — F32A Depression, unspecified: Secondary | ICD-10-CM

## 2023-09-07 DIAGNOSIS — Z1231 Encounter for screening mammogram for malignant neoplasm of breast: Secondary | ICD-10-CM | POA: Insufficient documentation

## 2023-09-07 DIAGNOSIS — Z1211 Encounter for screening for malignant neoplasm of colon: Secondary | ICD-10-CM | POA: Diagnosis not present

## 2023-09-07 MED ORDER — NA SULFATE-K SULFATE-MG SULF 17.5-3.13-1.6 GM/177ML PO SOLN: 1.0000 | Freq: Once | ORAL | 0 refills | Status: AC

## 2023-09-07 NOTE — Telephone Encounter (Signed)
 Gastroenterology Pre-Procedure Review  Request Date: 10/06/23 Requesting Physician: Dr. Antony Baumgartner  PATIENT REVIEW QUESTIONS: The patient responded to the following health history questions as indicated:    1. Are you having any GI issues? no 2. Do you have a personal history of Polyps? no 3. Do you have a family history of Colon Cancer or Polyps? no 4. Diabetes Mellitus? no 5. Joint replacements in the past 12 months?no 6. Major health problems in the past 3 months?no 7. Any artificial heart valves, MVP, or defibrillator?no    MEDICATIONS & ALLERGIES:    Patient reports the following regarding taking any anticoagulation/antiplatelet therapy:   Plavix, Coumadin, Eliquis, Xarelto, Lovenox, Pradaxa, Brilinta, or Effient? no Aspirin? no  Patient confirms/reports the following medications:  Current Outpatient Medications  Medication Sig Dispense Refill   Cholecalciferol (VITAMIN D PO) Take 1 tablet by mouth daily.     ferrous sulfate 325 (65 FE) MG EC tablet Take 325 mg by mouth 3 (three) times daily with meals.     lactobacillus acidophilus (BACID) TABS tablet Take 2 tablets by mouth 3 (three) times daily. (Patient not taking: Reported on 09/07/2023)     Multiple Vitamin (MULTIVITAMIN) tablet Take 1 tablet by mouth daily.     olopatadine (PATANOL) 0.1 % ophthalmic solution Place 1 drop into both eyes 2 (two) times daily. (Patient not taking: Reported on 09/07/2023) 5 mL 12   No current facility-administered medications for this visit.    Patient confirms/reports the following allergies:  Allergies  Allergen Reactions   Oatmeal Rash    No orders of the defined types were placed in this encounter.   AUTHORIZATION INFORMATION Primary Insurance: 1D#: Group #:  Secondary Insurance: 1D#: Group #:  SCHEDULE INFORMATION: Date: 10/06/23 Time: Location: [facility]

## 2023-09-07 NOTE — Assessment & Plan Note (Addendum)
 Anxiety and depression with scores of 8 for depression and 6 for anxiety. Managed with exercise, meditation, and journaling. Experiences isolation and loneliness. No current desire for medication or therapy. - Continue management with exercise, meditation, and journaling. - She had politely declined medication or referral for therapy at present.

## 2023-09-07 NOTE — Progress Notes (Signed)
 Established Patient Office Visit  Subjective:  Patient ID: Tammy Ray, female    DOB: March 29, 1981  Age: 43 y.o. MRN: [id number]  CC:  Chief Complaint  Patient presents with   Medical Management of Chronic Issues    HPI  Tammy Ray presents for chronic disease follow up. She has history of depression and anxiety, Vit D deficiency.  Anxiety and depression remain stable with scores of 8 and 6, respectively. She prefers to manage symptoms through exercise, meditation, and journaling, despite feelings of isolation due to a lack of nearby family. She spends most of her time with her daughter outside of work.   HPI   Past Medical History:  Diagnosis Date   Anxiety    Bilateral leg cramps    Depression    Kidney infection    UTI (lower urinary tract infection)     Past Surgical History:  Procedure Laterality Date   CESAREAN SECTION     LAPAROSCOPIC TUBAL LIGATION Bilateral 05/26/2016   Procedure: LAPAROSCOPIC TUBAL LIGATION;  Surgeon: Othelia Blinks, MD;  Location: [facility];  Service: Gynecology;  Laterality: Bilateral;    Family History  Problem Relation Age of Onset   Seizures Mother    Heart disease Father    Cancer Sister    Breast cancer Maternal Grandmother    Breast cancer Maternal Aunt     Social History   Socioeconomic History   Marital status: Single    Spouse name: Not on file   Number of children: 3   Years of education: Not on file   Highest education level: Associate degree: academic program  Occupational History   Not on file  Tobacco Use   Smoking status: Former    Current packs/day: 0.00    Types: Cigarettes    Quit date: 11/19/2013    Years since quitting: 9.8   Smokeless tobacco: Never  Vaping Use   Vaping status: Never Used  Substance and Sexual Activity   Alcohol use: Yes    Comment: ocassional   Drug use: No   Sexual activity: Yes    Birth control/protection: Condom  Other Topics Concern   Not on file  Social History  Narrative   Not on file   Social Drivers of Health   Financial Resource Strain: Not on file  Food Insecurity: Not on file  Transportation Needs: Not on file  Physical Activity: Not on file  Stress: Not on file  Social Connections: Not on file  Intimate Partner Violence: Not on file     Outpatient Medications Prior to Visit  Medication Sig Dispense Refill   Cholecalciferol  (VITAMIN D  PO) Take 1 tablet by mouth daily.     ferrous sulfate 325 (65 FE) MG EC tablet Take 325 mg by mouth 3 (three) times daily with meals.     Multiple Vitamin (MULTIVITAMIN) tablet Take 1 tablet by mouth daily.     lactobacillus acidophilus (BACID) TABS tablet Take 2 tablets by mouth 3 (three) times daily. (Patient not taking: Reported on 09/07/2023)     olopatadine  (PATANOL) 0.1 % ophthalmic solution Place 1 drop into both eyes 2 (two) times daily. (Patient not taking: Reported on 09/07/2023) 5 mL 12   Ascorbic Acid (VITA-C PO) Take 1 tablet by mouth daily. (Patient not taking: Reported on 09/07/2023)     No facility-administered medications prior to visit.    Allergies  Allergen Reactions   Oatmeal Rash    ROS Review of Systems Negative unless indicated in HPI.  Objective:    Physical Exam  BP 122/80   Pulse 69   Temp 97.7 F (36.5 C)   Ht 5\' 7"  (1.702 m)   Wt 178 lb 12.8 oz (81.1 kg)   LMP 08/29/2023   SpO2 99%   BMI 28.00 kg/m  Wt Readings from Last 3 Encounters:  09/07/23 178 lb 12.8 oz (81.1 kg)  02/23/23 177 lb 12.8 oz (80.6 kg)  08/18/22 182 lb (82.6 kg)     Health Maintenance  Topic Date Due   Hepatitis C Screening  Never done   Colonoscopy  Never done   COVID-19 Vaccine (3 - 2024-25 season) 09/20/2023 (Originally 01/22/2023)   INFLUENZA VACCINE  12/22/2023   Cervical Cancer Screening (HPV/Pap Cotest)  02/23/2028   DTaP/Tdap/Td (2 - Td or Tdap) 05/08/2028   HIV Screening  Completed   HPV VACCINES  Aged Out   Meningococcal B Vaccine  Aged Out    There are no  preventive care reminders to display for this patient.  Lab Results  Component Value Date   TSH 0.45 02/23/2023   Lab Results  Component Value Date   WBC 5.3 02/23/2023   HGB 12.1 02/23/2023   HCT 37.6 02/23/2023   MCV 92.4 02/23/2023   PLT 321.0 02/23/2023   Lab Results  Component Value Date   NA 139 02/23/2023   K 4.0 02/23/2023   CO2 30 02/23/2023   GLUCOSE 94 02/23/2023   BUN 14 02/23/2023   CREATININE 0.90 02/23/2023   BILITOT 0.5 04/25/2023   ALKPHOS 87 04/25/2023   AST 12 04/25/2023   ALT 9 04/25/2023   PROT 7.0 04/25/2023   ALBUMIN 4.1 04/25/2023   CALCIUM 9.6 02/23/2023   ANIONGAP 13 01/20/2014   GFR 77.63 02/23/2023   Lab Results  Component Value Date   CHOL 130 02/23/2023   Lab Results  Component Value Date   HDL 54.50 02/23/2023   Lab Results  Component Value Date   LDLCALC 64 02/23/2023   Lab Results  Component Value Date   TRIG 61.0 02/23/2023   Lab Results  Component Value Date   CHOLHDL 2 02/23/2023   Lab Results  Component Value Date   HGBA1C 5.1 02/04/2016      Assessment & Plan:  Anxiety and depression Assessment & Plan: Chronic anxiety and depression with scores of 8 for depression and 6 for anxiety. Managed with exercise, meditation, and journaling. Experiences isolation and loneliness. No current desire for medication or therapy. - Continue management with exercise, meditation, and journaling. - She had politely declined medication or referral for therapy at present.    Vitamin D  deficiency -     VITAMIN D  25 Hydroxy (Vit-D Deficiency, Fractures); Future  Encounter for screening colonoscopy -     Ambulatory referral to Gastroenterology  Need for hepatitis C screening test -     Hepatitis C antibody; Future  Annual physical exam -     CBC with Differential/Platelet; Future -     Comprehensive metabolic panel with GFR; Future -     Lipid panel; Future -     TSH; Future    Follow-up: Return in about 6 months  (around 03/08/2024) for physical, follow up with fasting lab 2 days prior.   Kwaku Mostafa, NP

## 2023-09-12 ENCOUNTER — Other Ambulatory Visit: Payer: Self-pay | Admitting: Nurse Practitioner

## 2023-09-12 DIAGNOSIS — R928 Other abnormal and inconclusive findings on diagnostic imaging of breast: Secondary | ICD-10-CM

## 2023-09-27 ENCOUNTER — Encounter: Payer: Self-pay | Admitting: Gastroenterology

## 2023-09-29 ENCOUNTER — Encounter: Payer: Self-pay | Admitting: Nurse Practitioner

## 2023-09-29 ENCOUNTER — Encounter (HOSPITAL_COMMUNITY): Payer: Self-pay

## 2023-10-02 DIAGNOSIS — Z419 Encounter for procedure for purposes other than remedying health state, unspecified: Secondary | ICD-10-CM | POA: Diagnosis not present

## 2023-10-05 ENCOUNTER — Encounter: Payer: Self-pay | Admitting: Gastroenterology

## 2023-10-05 NOTE — Anesthesia Preprocedure Evaluation (Signed)
 Anesthesia Evaluation  Patient identified by MRN, date of birth, ID band Patient awake    Reviewed: Allergy & Precautions, H&P , NPO status , Patient's Chart, lab work & pertinent test results  Airway Mallampati: II  TM Distance: >3 FB Neck ROM: Full    Dental no notable dental hx.    Pulmonary neg pulmonary ROS, former smoker   Pulmonary exam normal breath sounds clear to auscultation       Cardiovascular negative cardio ROS Normal cardiovascular exam Rhythm:Regular Rate:Normal     Neuro/Psych  PSYCHIATRIC DISORDERS Anxiety Depression    negative neurological ROS  negative psych ROS   GI/Hepatic negative GI ROS, Neg liver ROS,,,  Endo/Other  negative endocrine ROS    Renal/GU Renal diseasenegative Renal ROS  negative genitourinary   Musculoskeletal negative musculoskeletal ROS (+)    Abdominal   Peds negative pediatric ROS (+)  Hematology negative hematology ROS (+)   Anesthesia Other Findings UTI (lower urinary tract infection)  Kidney infection Bilateral leg cramps  Depression Anxiety  Abnormal liver enzymes    Reproductive/Obstetrics negative OB ROS                              Anesthesia Physical Anesthesia Plan  ASA: 2  Anesthesia Plan: General   Post-op Pain Management:    Induction: Intravenous  PONV Risk Score and Plan:   Airway Management Planned: Natural Airway and Nasal Cannula  Additional Equipment:   Intra-op Plan:   Post-operative Plan:   Informed Consent: I have reviewed the patients History and Physical, chart, labs and discussed the procedure including the risks, benefits and alternatives for the proposed anesthesia with the patient or authorized representative who has indicated his/her understanding and acceptance.     Dental Advisory Given  Plan Discussed with: Anesthesiologist, CRNA and Surgeon  Anesthesia Plan Comments: (Patient consented  for risks of anesthesia including but not limited to:  - adverse reactions to medications - risk of airway placement if required - damage to eyes, teeth, lips or other oral mucosa - nerve damage due to positioning  - sore throat or hoarseness - Damage to heart, brain, nerves, lungs, other parts of body or loss of life  Patient voiced understanding and assent.)         Anesthesia Quick Evaluation

## 2023-10-06 ENCOUNTER — Encounter
Admission: RE | Disposition: A | Discharge status: Home / Self Care | Payer: Self-pay | Source: Home / Self Care | Attending: Gastroenterology

## 2023-10-06 ENCOUNTER — Encounter: Payer: Self-pay | Admitting: Gastroenterology

## 2023-10-06 ENCOUNTER — Ambulatory Visit: Payer: Self-pay | Admitting: Anesthesiology

## 2023-10-06 ENCOUNTER — Ambulatory Visit
Admission: RE | Admit: 2023-10-06 | Discharge: 2023-10-06 | Disposition: A | Discharge status: Home / Self Care | Attending: Gastroenterology | Admitting: Gastroenterology

## 2023-10-06 ENCOUNTER — Other Ambulatory Visit: Payer: Self-pay

## 2023-10-06 DIAGNOSIS — Z1211 Encounter for screening for malignant neoplasm of colon: Secondary | ICD-10-CM | POA: Diagnosis not present

## 2023-10-06 DIAGNOSIS — Z87891 Personal history of nicotine dependence: Secondary | ICD-10-CM | POA: Diagnosis not present

## 2023-10-06 HISTORY — PX: COLONOSCOPY: SHX5424

## 2023-10-06 HISTORY — DX: Abnormal levels of other serum enzymes: R74.8

## 2023-10-06 HISTORY — DX: Palpitations: R00.2

## 2023-10-06 LAB — POCT PREGNANCY, URINE: Preg Test, Ur: NEGATIVE

## 2023-10-06 SURGERY — COLONOSCOPY
Anesthesia: General

## 2023-10-06 MED ORDER — LIDOCAINE HCL (CARDIAC) PF 100 MG/5ML IV SOSY
PREFILLED_SYRINGE | INTRAVENOUS | Status: DC | PRN
  Administered 2023-10-06: 50 mg via INTRAVENOUS

## 2023-10-06 MED ORDER — LACTATED RINGERS IV SOLN: INTRAVENOUS | Status: DC

## 2023-10-06 MED ORDER — SODIUM CHLORIDE 0.9 % IV SOLN
INTRAVENOUS | Status: DC
Start: 2023-10-06 — End: 2023-10-06

## 2023-10-06 MED ORDER — PROPOFOL 10 MG/ML IV BOLUS
INTRAVENOUS | Status: AC
  Filled 2023-10-06: qty 40

## 2023-10-06 MED ORDER — PROPOFOL 10 MG/ML IV BOLUS
INTRAVENOUS | Status: DC | PRN
  Administered 2023-10-06: 80 mg via INTRAVENOUS
  Administered 2023-10-06 (×2): 50 mg via INTRAVENOUS
  Administered 2023-10-06: 90 mg via INTRAVENOUS
  Administered 2023-10-06: 40 mg via INTRAVENOUS
  Administered 2023-10-06: 50 mg via INTRAVENOUS

## 2023-10-06 MED ORDER — STERILE WATER FOR IRRIGATION IR SOLN
Status: DC | PRN
  Administered 2023-10-06: 1

## 2023-10-06 SURGICAL SUPPLY — 5 items
GAUZE SPONGE 4X4 12PLY STRL (GAUZE/BANDAGES/DRESSINGS) IMPLANT
GOWN CVR UNV OPN BCK APRN NK (MISCELLANEOUS) ×2 IMPLANT
KIT PRC NS LF DISP ENDO (KITS) ×1 IMPLANT
MANIFOLD NEPTUNE II (INSTRUMENTS) IMPLANT
WATER STERILE IRR 250ML POUR (IV SOLUTION) ×1 IMPLANT

## 2023-10-06 NOTE — Op Note (Signed)
 [facility] Gastroenterology Patient Name: Tammy Ray Procedure Date: 10/06/2023 9:41 AM MRN: [id number] Account #: [id number] Date of Birth: December 02, 1980 Admit Type: Outpatient Age: 43 Room: [facility] OR ROOM 01 Gender: Female Note Status: Finalized Instrument Name: [id number] Procedure:             Colonoscopy Indications:           Screening for colorectal malignant neoplasm Providers:             Luke Salaam MD, MD Referring MD:          Tona Francis (Referring MD) Medicines:             Monitored Anesthesia Care Complications:         No immediate complications. Procedure:             Pre-Anesthesia Assessment:                        - Prior to the procedure, a History and Physical was                         performed, and patient medications, allergies and                         sensitivities were reviewed. The patient's tolerance                         of previous anesthesia was reviewed.                        - The risks and benefits of the procedure and the                         sedation options and risks were discussed with the                         patient. All questions were answered and informed                         consent was obtained.                        - ASA Grade Assessment: II - A patient with mild                         systemic disease.                        After obtaining informed consent, the colonoscope was                         passed under direct vision. Throughout the procedure,                         the patient's blood pressure, pulse, and oxygen                         saturations were monitored continuously. The                         Colonoscope was introduced through the  anus and                         advanced to the the cecum, identified by the                         appendiceal orifice. The colonoscopy was performed                         with ease. The patient tolerated the procedure well.                          The quality of the bowel preparation was good. The                         ileocecal valve, appendiceal orifice, and rectum were                         photographed. Findings:      The perianal and digital rectal examinations were normal.      The entire examined colon appeared normal on direct and retroflexion       views. Impression:            - The entire examined colon is normal on direct and                         retroflexion views.                        - No specimens collected. Recommendation:        - Discharge patient to home (with escort).                        - Resume previous diet.                        - Continue present medications.                        - Repeat colonoscopy in 10 years for screening                         purposes. Procedure Code(s):     --- Professional ---                        [phone], Colonoscopy, flexible; diagnostic, including                         collection of specimen(s) by brushing or washing, when                         performed (separate procedure) Diagnosis Code(s):     --- Professional ---                        Z12.11, Encounter for screening for malignant neoplasm                         of colon CPT copyright 2022 American Medical Association. All rights reserved. The codes documented in this report are preliminary  and upon coder review may  be revised to meet current compliance requirements. Luke Salaam, MD Luke Salaam MD, MD 10/06/2023 10:05:18 AM This report has been signed electronically. Number of Addenda: 0 Note Initiated On: 10/06/2023 9:41 AM Scope Withdrawal Time: 0 hours 11 minutes 48 seconds  Total Procedure Duration: 0 hours 16 minutes 9 seconds  Estimated Blood Loss:  Estimated blood loss: none.      [facility]

## 2023-10-06 NOTE — H&P (Signed)
 Luke Salaam, MD [address], [address], [place], [place], [address] [address], [place], [place], [address] Phone: [phone]  Fax: [phone]  Primary Care Physician:  Tona Francis, NP   Pre-Procedure History & Physical: HPI:  Tammy Ray is a 43 y.o. female is here for an colonoscopy.   Past Medical History:  Diagnosis Date   Abnormal liver enzymes    Anxiety    Bilateral leg cramps    Depression    Kidney infection    Palpitations    UTI (lower urinary tract infection)     Past Surgical History:  Procedure Laterality Date   CESAREAN SECTION     LAPAROSCOPIC TUBAL LIGATION Bilateral 05/26/2016   Procedure: LAPAROSCOPIC TUBAL LIGATION;  Surgeon: Othelia Blinks, MD;  Location: [facility];  Service: Gynecology;  Laterality: Bilateral;    Prior to Admission medications   Medication Sig Start Date End Date Taking? Authorizing Provider  Cholecalciferol  (VITAMIN D  PO) Take 1 tablet by mouth daily.   Yes [provider]  ferrous sulfate 325 (65 FE) MG EC tablet Take 325 mg by mouth 3 (three) times daily with meals.   Yes [provider]  Multiple Vitamin (MULTIVITAMIN) tablet Take 1 tablet by mouth daily.   Yes [provider]  lactobacillus acidophilus (BACID) TABS tablet Take 2 tablets by mouth 3 (three) times daily. Patient not taking: Reported on 09/07/2023    [provider]  olopatadine  (PATANOL) 0.1 % ophthalmic solution Place 1 drop into both eyes 2 (two) times daily. Patient not taking: Reported on 09/07/2023 07/29/22   Tona Francis, NP    Allergies as of 09/07/2023 - Review Complete 09/07/2023  Allergen Reaction Noted   Oatmeal Rash 12/05/2014    Family History  Problem Relation Age of Onset   Seizures Mother    Heart disease Father    Cancer Sister    Breast cancer Maternal Grandmother    Breast cancer Maternal Aunt     Social History   Socioeconomic History   Marital status: Single     Spouse name: Not on file   Number of children: 3   Years of education: Not on file   Highest education level: Associate degree: academic program  Occupational History   Not on file  Tobacco Use   Smoking status: Former    Current packs/day: 0.00    Types: Cigarettes    Quit date: 11/19/2013    Years since quitting: 9.8   Smokeless tobacco: Never  Vaping Use   Vaping status: Never Used  Substance and Sexual Activity   Alcohol use: Yes    Comment: ocassional   Drug use: No   Sexual activity: Yes    Birth control/protection: Condom  Other Topics Concern   Not on file  Social History Narrative   Not on file   Social Drivers of Health   Financial Resource Strain: Not on file  Food Insecurity: Not on file  Transportation Needs: Not on file  Physical Activity: Not on file  Stress: Not on file  Social Connections: Not on file  Intimate Partner Violence: Not on file    Review of Systems: See HPI, otherwise negative ROS  Physical Exam: BP (!) 153/87   Temp 97.7 F (36.5 C) (Temporal)   Resp 15   Ht 5' 5.98" (1.676 m)   Wt 79.9 kg   LMP 09/26/2023   SpO2 99%   BMI 28.45 kg/m  General:   Alert,  pleasant and cooperative in NAD Head:  Normocephalic and atraumatic. Neck:  Supple; no masses or thyromegaly. Lungs:  Clear throughout to auscultation, normal respiratory effort.    Heart:  +S1, +S2, Regular rate and rhythm, No edema. Abdomen:  Soft, nontender and nondistended. Normal bowel sounds, without guarding, and without rebound.   Neurologic:  Alert and  oriented x4;  grossly normal neurologically.  Impression/Plan: Tammy Ray is here for an colonoscopy to be performed for Screening colonoscopy average risk   Risks, benefits, limitations, and alternatives regarding  colonoscopy have been reviewed with the patient.  Questions have been answered.  All parties agreeable.   Luke Salaam, MD  10/06/2023, 9:10 AM

## 2023-10-06 NOTE — Anesthesia Postprocedure Evaluation (Signed)
 Anesthesia Post Note  Patient: Tammy Ray  Procedure(s) Performed: COLONOSCOPY  Patient location during evaluation: PACU Anesthesia Type: General Level of consciousness: awake and alert Pain management: pain level controlled Vital Signs Assessment: post-procedure vital signs reviewed and stable Respiratory status: spontaneous breathing, nonlabored ventilation, respiratory function stable and patient connected to nasal cannula oxygen Cardiovascular status: blood pressure returned to baseline and stable Postop Assessment: no apparent nausea or vomiting Anesthetic complications: no   No notable events documented.   Last Vitals:  Vitals:   10/06/23 1000 10/06/23 1027  BP: (!) 74/49 128/76  Pulse: 78 67  Resp: 20 16  Temp: 36.7 C 36.7 C  SpO2: 98% 100%    Last Pain:  Vitals:   10/06/23 1027  TempSrc:   PainSc: 0-No pain                 Tammy Ray

## 2023-10-06 NOTE — Transfer of Care (Signed)
 Immediate Anesthesia Transfer of Care Note  Patient: Tammy Ray  Procedure(s) Performed: COLONOSCOPY  Patient Location: PACU  Anesthesia Type: General  Level of Consciousness: awake, alert  and patient cooperative  Airway and Oxygen Therapy: Patient Spontanous Breathing and Patient connected to supplemental oxygen  Post-op Assessment: Post-op Vital signs reviewed, Patient's Cardiovascular Status Stable, Respiratory Function Stable, Patent Airway and No signs of Nausea or vomiting  Post-op Vital Signs: Reviewed and stable  Complications: No notable events documented.

## 2023-11-02 DIAGNOSIS — Z419 Encounter for procedure for purposes other than remedying health state, unspecified: Secondary | ICD-10-CM | POA: Diagnosis not present

## 2023-12-02 DIAGNOSIS — Z419 Encounter for procedure for purposes other than remedying health state, unspecified: Secondary | ICD-10-CM | POA: Diagnosis not present

## 2024-01-02 DIAGNOSIS — Z419 Encounter for procedure for purposes other than remedying health state, unspecified: Secondary | ICD-10-CM | POA: Diagnosis not present

## 2024-02-02 DIAGNOSIS — Z419 Encounter for procedure for purposes other than remedying health state, unspecified: Secondary | ICD-10-CM | POA: Diagnosis not present

## 2024-02-05 ENCOUNTER — Encounter: Payer: Self-pay | Admitting: Nurse Practitioner

## 2024-02-05 NOTE — Telephone Encounter (Signed)
 03/12/24  is for Fasting Lab 03/15/24 is for her Physical Is it okay to put these on the same day?

## 2024-03-03 DIAGNOSIS — Z419 Encounter for procedure for purposes other than remedying health state, unspecified: Secondary | ICD-10-CM | POA: Diagnosis not present

## 2024-03-12 ENCOUNTER — Other Ambulatory Visit

## 2024-03-15 ENCOUNTER — Ambulatory Visit: Payer: Self-pay | Admitting: Nurse Practitioner

## 2024-03-15 ENCOUNTER — Ambulatory Visit: Admitting: Nurse Practitioner

## 2024-03-15 ENCOUNTER — Encounter: Payer: Self-pay | Admitting: Nurse Practitioner

## 2024-03-15 VITALS — BP 124/82 | HR 73 | Temp 98.2°F | Ht 65.98 in | Wt 180.0 lb

## 2024-03-15 DIAGNOSIS — Z1159 Encounter for screening for other viral diseases: Secondary | ICD-10-CM

## 2024-03-15 DIAGNOSIS — Z114 Encounter for screening for human immunodeficiency virus [HIV]: Secondary | ICD-10-CM

## 2024-03-15 DIAGNOSIS — Z0000 Encounter for general adult medical examination without abnormal findings: Secondary | ICD-10-CM

## 2024-03-15 LAB — CBC WITH DIFFERENTIAL/PLATELET
Basophils Absolute: 0 K/uL (ref 0.0–0.1)
Basophils Relative: 0.2 % (ref 0.0–3.0)
Eosinophils Absolute: 0 K/uL (ref 0.0–0.7)
Eosinophils Relative: 0.6 % (ref 0.0–5.0)
HCT: 36.8 % (ref 36.0–46.0)
Hemoglobin: 12.1 g/dL (ref 12.0–15.0)
Lymphocytes Relative: 39.1 % (ref 12.0–46.0)
Lymphs Abs: 2 K/uL (ref 0.7–4.0)
MCHC: 32.9 g/dL (ref 30.0–36.0)
MCV: 91.1 fl (ref 78.0–100.0)
Monocytes Absolute: 0.4 K/uL (ref 0.1–1.0)
Monocytes Relative: 7.5 % (ref 3.0–12.0)
Neutro Abs: 2.7 K/uL (ref 1.4–7.7)
Neutrophils Relative %: 52.6 % (ref 43.0–77.0)
Platelets: 260 K/uL (ref 150.0–400.0)
RBC: 4.04 Mil/uL (ref 3.87–5.11)
RDW: 12.4 % (ref 11.5–15.5)
WBC: 5.1 K/uL (ref 4.0–10.5)

## 2024-03-15 LAB — COMPREHENSIVE METABOLIC PANEL WITH GFR
ALT: 9 U/L (ref 0–35)
AST: 12 U/L (ref 0–37)
Albumin: 4.2 g/dL (ref 3.5–5.2)
Alkaline Phosphatase: 109 U/L (ref 39–117)
BUN: 8 mg/dL (ref 6–23)
CO2: 26 meq/L (ref 19–32)
Calcium: 9 mg/dL (ref 8.4–10.5)
Chloride: 106 meq/L (ref 96–112)
Creatinine, Ser: 0.82 mg/dL (ref 0.40–1.20)
GFR: 86.16 mL/min (ref >60.00)
Glucose, Bld: 77 mg/dL (ref 70–99)
Potassium: 3.8 meq/L (ref 3.5–5.1)
Sodium: 140 meq/L (ref 135–145)
Total Bilirubin: 0.4 mg/dL (ref 0.2–1.2)
Total Protein: 6.6 g/dL (ref 6.0–8.3)

## 2024-03-15 LAB — LIPID PANEL
Cholesterol: 118 mg/dL (ref 0–200)
HDL: 51.4 mg/dL (ref >39.00)
LDL Cholesterol: 53 mg/dL (ref 0–99)
NonHDL: 66.83
Total CHOL/HDL Ratio: 2
Triglycerides: 70 mg/dL (ref 0.0–149.0)
VLDL: 14 mg/dL (ref 0.0–40.0)

## 2024-03-15 LAB — TSH: TSH: 0.75 u[IU]/mL (ref 0.35–5.50)

## 2024-03-15 LAB — HEMOGLOBIN A1C: Hgb A1c MFr Bld: 5 % (ref 4.6–6.5)

## 2024-03-15 NOTE — Progress Notes (Signed)
 Established Patient Office Visit  Subjective:  Patient ID: Tammy Ray, female    DOB: [birth year]  Age: 43 y.o. MRN: [id number]  CC:  Chief Complaint  Patient presents with   Annual Exam   Discussed the use of a AI scribe software for clinical note transcription with the patient, who gave verbal consent to proceed.  HPI  Tammy Ray presents for annual physocal.  Diet: Patient does not eat meat. Her diet is primarily vegetarian, with occasional dairy, fish, and seafood.   Vaccine Flu: Declined Tetanus: 2019 Shingles:Not at age Pneumonia: Not at age  Colonoscopy: 2025, repeat every 10 years Cervical cancer screening :2024 Mammogram: 2025, need further evaluation of left breast has not schedule yet.  Family history:  Colon cancer: No  Breast cancer: yes, GM and Anut  Dentist:as needed Ophthalmology:due HIV screening:not done Hep C screening: not done Tobacco use: former Alcohol use: occasionally  Illicit drugs:no   HPI   Past Medical History:  Diagnosis Date   Abnormal liver enzymes    Anxiety    Bilateral leg cramps    Depression    Kidney infection    Palpitations    UTI (lower urinary tract infection)     Past Surgical History:  Procedure Laterality Date   CESAREAN SECTION     COLONOSCOPY N/A 10/06/2023   Procedure: COLONOSCOPY;  Surgeon: Therisa Bi, MD;  Location: [facility];  Service: Endoscopy;  Laterality: N/A;   LAPAROSCOPIC TUBAL LIGATION Bilateral 05/26/2016   Procedure: LAPAROSCOPIC TUBAL LIGATION;  Surgeon: Ozell LITTIE Cowman, MD;  Location: [facility];  Service: Gynecology;  Laterality: Bilateral;    Family History  Problem Relation Age of Onset   Seizures Mother    Heart disease Father    Cancer Sister    Breast cancer Maternal Grandmother    Breast cancer Maternal Aunt     Social History   Socioeconomic History   Marital status: Single    Spouse name: Not on file   Number of children: 3   Years of education: Not on  file   Highest education level: Associate degree: academic program  Occupational History   Not on file  Tobacco Use   Smoking status: Former    Current packs/day: 0.00    Types: Cigarettes    Quit date: 11/19/2013    Years since quitting: 10.3   Smokeless tobacco: Never  Vaping Use   Vaping status: Never Used  Substance and Sexual Activity   Alcohol use: Yes    Comment: ocassional   Drug use: No   Sexual activity: Yes    Birth control/protection: Condom  Other Topics Concern   Not on file  Social History Narrative   Not on file   Social Drivers of Health   Financial Resource Strain: Not on file  Food Insecurity: Not on file  Transportation Needs: Not on file  Physical Activity: Not on file  Stress: Not on file  Social Connections: Not on file  Intimate Partner Violence: Not on file     Outpatient Medications Prior to Visit  Medication Sig Dispense Refill   Cholecalciferol  (VITAMIN D  PO) Take 1 tablet by mouth daily.     ferrous sulfate 325 (65 FE) MG EC tablet Take 325 mg by mouth 3 (three) times daily with meals.     lactobacillus acidophilus (BACID) TABS tablet Take 2 tablets by mouth 3 (three) times daily.     Multiple Vitamin (MULTIVITAMIN) tablet Take 1 tablet by mouth daily.  olopatadine  (PATANOL) 0.1 % ophthalmic solution Place 1 drop into both eyes 2 (two) times daily. 5 mL 12   No facility-administered medications prior to visit.    Allergies  Allergen Reactions   Oatmeal Rash    ROS Review of Systems Negative unless indicated in HPI.    Objective:    Physical Exam Constitutional:      Appearance: Normal appearance. She is normal weight.  HENT:     Head: Normocephalic.     Right Ear: Tympanic membrane normal.     Left Ear: Tympanic membrane normal.     Mouth/Throat:     Mouth: Mucous membranes are moist.  Eyes:     Extraocular Movements: Extraocular movements intact.     Conjunctiva/sclera: Conjunctivae normal.     Pupils: Pupils are  equal, round, and reactive to light.  Neck:     Thyroid: No thyroid mass or thyroid tenderness.  Cardiovascular:     Rate and Rhythm: Normal rate and regular rhythm.     Pulses: Normal pulses.     Heart sounds: Normal heart sounds. No murmur heard. Pulmonary:     Effort: Pulmonary effort is normal.     Breath sounds: Normal breath sounds.  Chest:  Breasts:    Right: Normal. No inverted nipple.     Left: Normal. No inverted nipple.  Abdominal:     General: Bowel sounds are normal.     Palpations: Abdomen is soft. There is no mass.     Tenderness: There is no abdominal tenderness. There is no rebound.  Musculoskeletal:        General: No swelling.     Cervical back: Neck supple. No tenderness.     Right lower leg: No edema.     Left lower leg: No edema.  Lymphadenopathy:     Upper Body:     Right upper body: No axillary adenopathy.     Left upper body: No axillary adenopathy.  Skin:    Findings: No bruising, erythema or rash.  Neurological:     General: No focal deficit present.     Mental Status: She is alert and oriented to person, place, and time. Mental status is at baseline.  Psychiatric:        Mood and Affect: Mood normal.        Behavior: Behavior normal.        Thought Content: Thought content normal.        Judgment: Judgment normal.     BP 124/82   Pulse 73   Temp 98.2 F (36.8 C)   Ht 5' 5.98 (1.676 m)   Wt 180 lb (81.6 kg)   SpO2 96%   BMI 29.07 kg/m  Wt Readings from Last 3 Encounters:  03/15/24 180 lb (81.6 kg)  10/06/23 176 lb 3.2 oz (79.9 kg)  09/07/23 178 lb 12.8 oz (81.1 kg)     Health Maintenance  Topic Date Due   Hepatitis B Vaccines 19-59 Average Risk (1 of 3 - 19+ 3-dose series) Never done   HPV VACCINES (1 - 3-dose SCDM series) Never done   COVID-19 Vaccine (3 - 2025-26 season) 03/30/2024 (Originally 01/22/2024)   Influenza Vaccine  08/20/2024 (Originally 12/22/2023)   Mammogram  09/06/2025   Cervical Cancer Screening (HPV/Pap Cotest)   02/23/2028   DTaP/Tdap/Td (2 - Td or Tdap) 05/08/2028   Colonoscopy  10/05/2033   Hepatitis C Screening  Completed   HIV Screening  Completed   Pneumococcal Vaccine  Aged Out  Meningococcal B Vaccine  Aged Out       Topic Date Due   Hepatitis B Vaccines 19-59 Average Risk (1 of 3 - 19+ 3-dose series) Never done   HPV VACCINES (1 - 3-dose SCDM series) Never done    Lab Results  Component Value Date   TSH 0.75 03/15/2024   Lab Results  Component Value Date   WBC 5.1 03/15/2024   HGB 12.1 03/15/2024   HCT 36.8 03/15/2024   MCV 91.1 03/15/2024   PLT 260.0 03/15/2024   Lab Results  Component Value Date   NA 140 03/15/2024   K 3.8 03/15/2024   CO2 26 03/15/2024   GLUCOSE 77 03/15/2024   BUN 8 03/15/2024   CREATININE 0.82 03/15/2024   BILITOT 0.4 03/15/2024   ALKPHOS 109 03/15/2024   AST 12 03/15/2024   ALT 9 03/15/2024   PROT 6.6 03/15/2024   ALBUMIN 4.2 03/15/2024   CALCIUM 9.0 03/15/2024   ANIONGAP 13 01/20/2014   GFR 86.16 03/15/2024   Lab Results  Component Value Date   CHOL 118 03/15/2024   Lab Results  Component Value Date   HDL 51.40 03/15/2024   Lab Results  Component Value Date   LDLCALC 53 03/15/2024   Lab Results  Component Value Date   TRIG 70.0 03/15/2024   Lab Results  Component Value Date   CHOLHDL 2 03/15/2024   Lab Results  Component Value Date   HGBA1C 5.0 03/15/2024      Assessment & Plan:  Annual physical exam Assessment & Plan: Physical exam completed. Dense breast tissue noted on previous mammogram. Follow-up imaging for the left breast was not completed. Declined flu. Coloscopy, and cervical cancer screening uptodate  -Advised to see an ophthalmology and dentist annually.  -Will checks labs as outlined.    Orders: -     CBC with Differential/Platelet -     Comprehensive metabolic panel with GFR -     Lipid panel -     TSH -     Hemoglobin A1c  Need for hepatitis C screening test -     Hepatitis C  antibody  Encounter for screening for HIV -     HIV Antibody (routine testing w rflx)    Follow-up: No follow-ups on file.   Tanairy Payeur, NP

## 2024-03-15 NOTE — Patient Instructions (Signed)
 YOU are overdue for further evaluation of left breast, PLEASE CALL AND GET THIS SCHEDULED! [facility] - call [phone]   Preventive Care 60-45 Years Old, Female Preventive care refers to lifestyle choices and visits with your health care provider that can promote health and wellness. Preventive care visits are also called wellness exams. What can I expect for my preventive care visit? Counseling Your health care provider may ask you questions about your: Medical history, including: Past medical problems. Family medical history. Pregnancy history. Current health, including: Menstrual cycle. Method of birth control. Emotional well-being. Home life and relationship well-being. Sexual activity and sexual health. Lifestyle, including: Alcohol, nicotine or tobacco, and drug use. Access to firearms. Diet, exercise, and sleep habits. Work and work Astronomer. Sunscreen use. Safety issues such as seatbelt and bike helmet use. Physical exam Your health care provider will check your: Height and weight. These may be used to calculate your BMI (body mass index). BMI is a measurement that tells if you are at a healthy weight. Waist circumference. This measures the distance around your waistline. This measurement also tells if you are at a healthy weight and may help predict your risk of certain diseases, such as type 2 diabetes and high blood pressure. Heart rate and blood pressure. Body temperature. Skin for abnormal spots. What immunizations do I need?  Vaccines are usually given at various ages, according to a schedule. Your health care provider will recommend vaccines for you based on your age, medical history, and lifestyle or other factors, such as travel or where you work. What tests do I need? Screening Your health care provider may recommend screening tests for certain conditions. This may include: Lipid and cholesterol levels. Diabetes screening. This is done by  checking your blood sugar (glucose) after you have not eaten for a while (fasting). Pelvic exam and Pap test. Hepatitis B test. Hepatitis C test. HIV (human immunodeficiency virus) test. STI (sexually transmitted infection) testing, if you are at risk. Lung cancer screening. Colorectal cancer screening. Mammogram. Talk with your health care provider about when you should start having regular mammograms. This may depend on whether you have a family history of breast cancer. BRCA-related cancer screening. This may be done if you have a family history of breast, ovarian, tubal, or peritoneal cancers. Bone density scan. This is done to screen for osteoporosis. Talk with your health care provider about your test results, treatment options, and if necessary, the need for more tests. Follow these instructions at home: Eating and drinking  Eat a diet that includes fresh fruits and vegetables, whole grains, lean protein, and low-fat dairy products. Take vitamin and mineral supplements as recommended by your health care provider. Do not drink alcohol if: Your health care provider tells you not to drink. You are pregnant, may be pregnant, or are planning to become pregnant. If you drink alcohol: Limit how much you have to 0-1 drink a day. Know how much alcohol is in your drink. In the [place] one drink equals one 12 oz bottle of beer (355 mL), one 5 oz glass of wine (148 mL), or one 1 oz glass of hard liquor (44 mL). Lifestyle Brush your teeth every morning and night with fluoride toothpaste. Floss one time each day. Exercise for at least 30 minutes 5 or more days each week. Do not use any products that contain nicotine or tobacco. These products include cigarettes, chewing tobacco, and vaping devices, such as e-cigarettes. If you need help quitting, ask your health  care provider. Do not use drugs. If you are sexually active, practice safe sex. Use a condom or other form of protection to prevent  STIs. If you do not wish to become pregnant, use a form of birth control. If you plan to become pregnant, see your health care provider for a prepregnancy visit. Take aspirin only as told by your health care provider. Make sure that you understand how much to take and what form to take. Work with your health care provider to find out whether it is safe and beneficial for you to take aspirin daily. Find healthy ways to manage stress, such as: Meditation, yoga, or listening to music. Journaling. Talking to a trusted person. Spending time with friends and family. Minimize exposure to UV radiation to reduce your risk of skin cancer. Safety Always wear your seat belt while driving or riding in a vehicle. Do not drive: If you have been drinking alcohol. Do not ride with someone who has been drinking. When you are tired or distracted. While texting. If you have been using any mind-altering substances or drugs. Wear a helmet and other protective equipment during sports activities. If you have firearms in your house, make sure you follow all gun safety procedures. Seek help if you have been physically or sexually abused. What's next? Visit your health care provider once a year for an annual wellness visit. Ask your health care provider how often you should have your eyes and teeth checked. Stay up to date on all vaccines. This information is not intended to replace advice given to you by your health care provider. Make sure you discuss any questions you have with your health care provider. Document Revised: 11/04/2020 Document Reviewed: 11/04/2020 Elsevier Patient Education  2024 [employer].

## 2024-03-15 NOTE — Assessment & Plan Note (Addendum)
 Physical exam completed. Dense breast tissue noted on previous mammogram. Follow-up imaging for the left breast was not completed. Declined flu. Coloscopy, and cervical cancer screening uptodate  -Advised to see an ophthalmology and dentist annually.  -Will checks labs as outlined.

## 2024-03-16 LAB — HIV ANTIBODY (ROUTINE TESTING W REFLEX)
HIV 1&2 Ab, 4th Generation: NONREACTIVE
HIV FINAL INTERPRETATION: NEGATIVE

## 2024-03-16 LAB — HEPATITIS C ANTIBODY: Hepatitis C Ab: NONREACTIVE

## 2024-04-03 DIAGNOSIS — Z419 Encounter for procedure for purposes other than remedying health state, unspecified: Secondary | ICD-10-CM | POA: Diagnosis not present
